# Patient Record
Sex: Male | Born: 1970 | Race: White | Hispanic: No | Marital: Married | Smoking: Former smoker
Health system: Southern US, Community
[De-identification: ages and names within clinical notes are randomized; demographics above are authoritative.]

## PROBLEM LIST (undated history)

## (undated) DIAGNOSIS — N289 Disorder of kidney and ureter, unspecified: Secondary | ICD-10-CM

---

## 2006-05-22 ENCOUNTER — Emergency Department: Payer: Self-pay | Admitting: Emergency Medicine

## 2008-07-24 ENCOUNTER — Emergency Department: Payer: Self-pay | Admitting: Emergency Medicine

## 2009-05-01 ENCOUNTER — Emergency Department: Payer: Self-pay | Admitting: Unknown Physician Specialty

## 2011-10-21 ENCOUNTER — Emergency Department: Payer: Self-pay | Admitting: Emergency Medicine

## 2014-02-24 ENCOUNTER — Emergency Department: Payer: Self-pay | Admitting: Emergency Medicine

## 2014-08-03 ENCOUNTER — Emergency Department: Payer: Self-pay | Admitting: Internal Medicine

## 2014-08-03 LAB — URINALYSIS, COMPLETE
BACTERIA: NONE SEEN
Bilirubin,UR: NEGATIVE
Blood: NEGATIVE
GLUCOSE, UR: NEGATIVE mg/dL (ref 0–75)
KETONE: NEGATIVE
Leukocyte Esterase: NEGATIVE
Nitrite: NEGATIVE
PH: 5 (ref 4.5–8.0)
PROTEIN: NEGATIVE
Specific Gravity: 1.034 (ref 1.003–1.030)
Squamous Epithelial: NONE SEEN
WBC UR: 1 /HPF (ref 0–5)

## 2016-10-02 ENCOUNTER — Encounter: Payer: Self-pay | Admitting: Emergency Medicine

## 2016-10-02 ENCOUNTER — Emergency Department: Payer: Self-pay

## 2016-10-02 ENCOUNTER — Emergency Department
Admission: EM | Admit: 2016-10-02 | Discharge: 2016-10-02 | Disposition: A | Discharge status: Home / Self Care | Payer: Self-pay | Attending: Emergency Medicine | Admitting: Emergency Medicine

## 2016-10-02 DIAGNOSIS — F17200 Nicotine dependence, unspecified, uncomplicated: Secondary | ICD-10-CM | POA: Insufficient documentation

## 2016-10-02 DIAGNOSIS — N200 Calculus of kidney: Secondary | ICD-10-CM

## 2016-10-02 HISTORY — DX: Disorder of kidney and ureter, unspecified: N28.9

## 2016-10-02 LAB — CBC
HEMATOCRIT: 44.2 % (ref 40.0–52.0)
HEMOGLOBIN: 15.2 g/dL (ref 13.0–18.0)
MCH: 31.4 pg (ref 26.0–34.0)
MCHC: 34.4 g/dL (ref 32.0–36.0)
MCV: 91.4 fL (ref 80.0–100.0)
Platelets: 245 10*3/uL (ref 150–440)
RBC: 4.83 MIL/uL (ref 4.40–5.90)
RDW: 13.7 % (ref 11.5–14.5)
WBC: 6.3 10*3/uL (ref 3.8–10.6)

## 2016-10-02 LAB — BASIC METABOLIC PANEL
ANION GAP: 8 (ref 5–15)
BUN: 22 mg/dL — ABNORMAL HIGH (ref 6–20)
CHLORIDE: 106 mmol/L (ref 101–111)
CO2: 24 mmol/L (ref 22–32)
Calcium: 9.4 mg/dL (ref 8.9–10.3)
Creatinine, Ser: 1.38 mg/dL — ABNORMAL HIGH (ref 0.61–1.24)
GFR calc non Af Amer: >60 mL/min (ref >60)
Glucose, Bld: 136 mg/dL — ABNORMAL HIGH (ref 65–99)
Potassium: 3.7 mmol/L (ref 3.5–5.1)
Sodium: 138 mmol/L (ref 135–145)

## 2016-10-02 LAB — URINALYSIS COMPLETE WITH MICROSCOPIC (ARMC ONLY)
Bilirubin Urine: NEGATIVE
Glucose, UA: NEGATIVE mg/dL
LEUKOCYTES UA: NEGATIVE
NITRITE: NEGATIVE
PROTEIN: 100 mg/dL — AB
SPECIFIC GRAVITY, URINE: 1.035 — AB (ref 1.005–1.030)
pH: 5 (ref 5.0–8.0)

## 2016-10-02 MED ORDER — ONDANSETRON HCL 4 MG/2ML IJ SOLN
4.0000 mg | Freq: Once | INTRAMUSCULAR | Status: AC
  Administered 2016-10-02: 4 mg via INTRAVENOUS
  Filled 2016-10-02: qty 2

## 2016-10-02 MED ORDER — TAMSULOSIN HCL 0.4 MG PO CAPS: 0.4000 mg | ORAL_CAPSULE | Freq: Every day | ORAL | 0 refills | Status: DC

## 2016-10-02 MED ORDER — MORPHINE SULFATE (PF) 4 MG/ML IV SOLN
4.0000 mg | Freq: Once | INTRAVENOUS | Status: AC
  Administered 2016-10-02: 4 mg via INTRAVENOUS
  Filled 2016-10-02: qty 1

## 2016-10-02 MED ORDER — OXYCODONE-ACETAMINOPHEN 5-325 MG PO TABS: 1.0000 | ORAL_TABLET | ORAL | 0 refills | Status: DC | PRN

## 2016-10-02 MED ORDER — ONDANSETRON HCL 4 MG PO TABS: 4.0000 mg | ORAL_TABLET | Freq: Three times a day (TID) | ORAL | 0 refills | Status: DC | PRN

## 2016-10-02 NOTE — Discharge Instructions (Signed)
Please seek medical attention for any high fevers, chest pain, shortness of breath, change in behavior, persistent vomiting, bloody stool or any other new or concerning symptoms.  

## 2016-10-02 NOTE — ED Provider Notes (Signed)
[facility] Emergency Department Provider Note    ____________________________________________   I have reviewed the triage vital signs and the nursing notes.   HISTORY  Chief Complaint Flank Pain   History limited by: Not Limited   HPI Scott PassyRobert C Morrison is a 45 y.o. male who presents to the emergency department today because of concerns for flank pain. It is located on the right side. It started today. It is severe. It is sharp. It radiates into his groin.It has been associated with nausea and vomiting. He states that he has had pain like this before when he is had a kidney stone. No fevers.    Past Medical History:  Diagnosis Date  . Renal disorder     There are no active problems to display for this patient.   History reviewed. No pertinent surgical history.  Prior to Admission medications   Medication Sig Start Date End Date Taking? Authorizing Provider  ondansetron (ZOFRAN) 4 MG tablet Take 1 tablet (4 mg total) by mouth every 8 (eight) hours as needed for nausea or vomiting. 10/02/16   [name] Yareliz Thorstenson, MD  oxyCODONE-acetaminophen (ROXICET) 5-325 MG tablet Take 1 tablet by mouth every 4 (four) hours as needed for severe pain. 10/02/16   [name] [name], MD  tamsulosin (FLOMAX) 0.4 MG CAPS capsule Take 1 capsule (0.4 mg total) by mouth daily. 10/02/16   [name] [name], MD    Allergies Patient has no known allergies.  History reviewed. No pertinent family history.  Social History Social History  Substance Use Topics  . Smoking status: Current Every Day Smoker  . Smokeless tobacco: Never Used  . Alcohol use Yes    Review of Systems  Constitutional: Negative for fever. Cardiovascular: Negative for chest pain. Respiratory: Negative for shortness of breath. Gastrointestinal: Positive for right flank pain Genitourinary: Negative for dysuria. Neurological: Negative for headaches, focal weakness or numbness.  10-point ROS otherwise  negative.  ____________________________________________   PHYSICAL EXAM:  VITAL SIGNS: ED Triage Vitals  Enc Vitals Group     BP 10/02/16 1357 (!) 142/91     Pulse Rate 10/02/16 1357 71     Resp 10/02/16 1357 16     Temp 10/02/16 1357 97.3 F (36.3 C)     Temp Source 10/02/16 1357 Oral     SpO2 10/02/16 1357 96 %     Weight 10/02/16 1348 260 lb (117.9 kg)     Height 10/02/16 1348 5\' 11"  (1.803 m)     Head Circumference --      Peak Flow --      Pain Score 10/02/16 1348 10   Constitutional: Alert and oriented. Appears uncomfortable Eyes: Conjunctivae are normal. Normal extraocular movements. ENT   Head: Normocephalic and atraumatic.   Nose: No congestion/rhinnorhea.   Mouth/Throat: Mucous membranes are moist.   Neck: No stridor. Hematological/Lymphatic/Immunilogical: No cervical lymphadenopathy. Cardiovascular: Normal rate, regular rhythm.  No murmurs, rubs, or gallops. Respiratory: Normal respiratory effort without tachypnea nor retractions. Breath sounds are clear and equal bilaterally. No wheezes/rales/rhonchi. Gastrointestinal: Soft and nontender. No distention.  Genitourinary: Deferred Musculoskeletal: Normal range of motion in all extremities. No lower extremity edema. Neurologic:  Normal speech and language. No gross focal neurologic deficits are appreciated.  Skin:  Skin is warm, dry and intact. No rash noted. Psychiatric: Mood and affect are normal. Speech and behavior are normal. Patient exhibits appropriate insight and judgment.  ____________________________________________    LABS (pertinent positives/negatives)  Labs Reviewed  URINALYSIS COMPLETEWITH MICROSCOPIC ([facility] ONLY) -  Abnormal; Notable for the following:       Result Value   Color, Urine AMBER (*)    APPearance HAZY (*)    Ketones, ur TRACE (*)    Specific Gravity, Urine 1.035 (*)    Hgb urine dipstick 3+ (*)    Protein, ur 100 (*)    Bacteria, UA RARE (*)    Squamous Epithelial  / LPF 0-5 (*)    All other components within normal limits  BASIC METABOLIC PANEL - Abnormal; Notable for the following:    Glucose, Bld 136 (*)    BUN 22 (*)    Creatinine, Ser 1.38 (*)    All other components within normal limits  CBC     ____________________________________________   EKG  None  ____________________________________________    RADIOLOGY  US renal IMPRESSION: Mild fullness of the right pelvis compared to the left, with no other comparison. This may reflect extrarenal pelvis, however, if there is concern for a distal ureteral obstruction, noncontrast CT should be considered.  ____________________________________________   PROCEDURES  Procedures  ____________________________________________   INITIAL IMPRESSION / ASSESSMENT AND PLAN / ED COURSE  Pertinent labs & imaging results that were available during my care of the patient were reviewed by me and considered in my medical decision making (see chart for details).  Patient with right-sided flank pain. History of kidney stones. Urine does have red blood cells without any white blood cells. No leukocytosis and serum. At this point I think complicated kidney stone. Ultrasound did not show any significant hydronephrosis. Will give patient pain medication, nausea medication and Flomax. Will give patient urology follow-up information.  ____________________________________________   FINAL CLINICAL IMPRESSION(S) / ED DIAGNOSES  Final diagnoses:  Kidney stone     Note: This dictation was prepared with Dragon dictation. Any transcriptional errors that result from this process are unintentional    [name] [name], MD 10/02/16 1614

## 2016-10-02 NOTE — ED Triage Notes (Signed)
Pt to ed with c/o right flank and right groin pain acute onset today at 1200.  Pt states hx of kidney stones.

## 2016-10-07 ENCOUNTER — Encounter: Payer: Self-pay | Admitting: Emergency Medicine

## 2016-10-07 ENCOUNTER — Emergency Department
Admission: EM | Admit: 2016-10-07 | Discharge: 2016-10-07 | Disposition: A | Discharge status: Home / Self Care | Payer: Self-pay | Attending: Emergency Medicine | Admitting: Emergency Medicine

## 2016-10-07 ENCOUNTER — Emergency Department: Payer: Self-pay

## 2016-10-07 DIAGNOSIS — N23 Unspecified renal colic: Secondary | ICD-10-CM | POA: Insufficient documentation

## 2016-10-07 DIAGNOSIS — F17200 Nicotine dependence, unspecified, uncomplicated: Secondary | ICD-10-CM | POA: Insufficient documentation

## 2016-10-07 DIAGNOSIS — R109 Unspecified abdominal pain: Secondary | ICD-10-CM

## 2016-10-07 LAB — BASIC METABOLIC PANEL
ANION GAP: 6 (ref 5–15)
BUN: 14 mg/dL (ref 6–20)
CHLORIDE: 104 mmol/L (ref 101–111)
CO2: 29 mmol/L (ref 22–32)
Calcium: 9.2 mg/dL (ref 8.9–10.3)
Creatinine, Ser: 0.98 mg/dL (ref 0.61–1.24)
GFR calc Af Amer: >60 mL/min (ref >60)
GLUCOSE: 87 mg/dL (ref 65–99)
POTASSIUM: 3.8 mmol/L (ref 3.5–5.1)
Sodium: 139 mmol/L (ref 135–145)

## 2016-10-07 LAB — URINALYSIS COMPLETE WITH MICROSCOPIC (ARMC ONLY)
BILIRUBIN URINE: NEGATIVE
Bacteria, UA: NONE SEEN
Glucose, UA: NEGATIVE mg/dL
KETONES UR: NEGATIVE mg/dL
LEUKOCYTES UA: NEGATIVE
Nitrite: NEGATIVE
PH: 5 (ref 5.0–8.0)
PROTEIN: 30 mg/dL — AB
SQUAMOUS EPITHELIAL / LPF: NONE SEEN
Specific Gravity, Urine: 1.024 (ref 1.005–1.030)

## 2016-10-07 LAB — CBC
HEMATOCRIT: 44.4 % (ref 40.0–52.0)
HEMOGLOBIN: 15 g/dL (ref 13.0–18.0)
MCH: 31.1 pg (ref 26.0–34.0)
MCHC: 33.9 g/dL (ref 32.0–36.0)
MCV: 91.9 fL (ref 80.0–100.0)
Platelets: 240 10*3/uL (ref 150–440)
RBC: 4.83 MIL/uL (ref 4.40–5.90)
RDW: 13.6 % (ref 11.5–14.5)
WBC: 7.5 10*3/uL (ref 3.8–10.6)

## 2016-10-07 MED ORDER — OXYCODONE-ACETAMINOPHEN 5-325 MG PO TABS: 1.0000 | ORAL_TABLET | Freq: Four times a day (QID) | ORAL | 0 refills | Status: DC | PRN

## 2016-10-07 MED ORDER — KETOROLAC TROMETHAMINE 30 MG/ML IJ SOLN
30.0000 mg | Freq: Once | INTRAMUSCULAR | Status: AC
  Administered 2016-10-07: 30 mg via INTRAVENOUS
  Filled 2016-10-07: qty 1

## 2016-10-07 MED ORDER — KETOROLAC TROMETHAMINE 10 MG PO TABS: 10.0000 mg | ORAL_TABLET | Freq: Three times a day (TID) | ORAL | 0 refills | Status: DC | PRN

## 2016-10-07 MED ORDER — SODIUM CHLORIDE 0.9 % IV BOLUS (SEPSIS)
1000.0000 mL | Freq: Once | INTRAVENOUS | Status: AC
  Administered 2016-10-07: 1000 mL via INTRAVENOUS

## 2016-10-07 NOTE — ED Triage Notes (Signed)
Pt presents with recurrent right side flank pain. Was seen Friday for kidney stone, discharged and expected to pass stone but has not. Pt with pain 10/10 sharp

## 2016-10-07 NOTE — Discharge Instructions (Signed)
Please call to make an appointment with Dr. Apolinar JunesBrandon for further evaluation of your kidney stone.  Return to the emergency department for severe pain, inability to keep down fluids, fever, or any other symptoms concerning to you.

## 2016-10-07 NOTE — ED Provider Notes (Signed)
[facility] Emergency Department Provider Note  ____________________________________________  Time seen: Approximately 1:53 PM  I have reviewed the triage vital signs and the nursing notes.   HISTORY  Chief Complaint Flank Pain    HPI Scott Morrison is a 45 y.o. male with a history of renal colic, presenting with ongoing right flank pain radiating to the right lower quadrant with urinary symptoms. The patient was seen here 6 days ago, with the same symptoms plus vomiting, which has now resolved. He had an ultrasound which showed rightfullness in the pelvis concerning for stone. He had a mildly increased creatinine, but no significant blood in the urine, and his symptoms were well controlled. Since then, he continues to have intermittent sharp pains and occasionally is not symptomatic. He has not had fever, chills, or recent nausea or vomiting. No diarrhea. He occasionally has changes in the stream of his urine or painful urination.   Past Medical History:  Diagnosis Date  . Renal disorder     There are no active problems to display for this patient.   History reviewed. No pertinent surgical history.  Current Outpatient Rx  . Order #: [id number] Class: Print  . Order #: [id number] Class: Print  . Order #: [id number] Class: Print    Allergies Patient has no known allergies.  History reviewed. No pertinent family history.  Social History Social History  Substance Use Topics  . Smoking status: Current Every Day Smoker  . Smokeless tobacco: Never Used  . Alcohol use Yes    Review of Systems Constitutional: No fever/chills. Eyes: No visual changes. ENT: No sore throat. No congestion or rhinorrhea. Cardiovascular: Denies chest pain. Denies palpitations. Respiratory: Denies shortness of breath.  No cough. Gastrointestinal: Positive right flank pain radiating to the right lower quadrant.  No nausea, no vomiting.  No diarrhea.  No  constipation. Genitourinary: Positive for dysuria. No hematuria. Positive change in flow. Musculoskeletal: Negative for back pain except for right flank pain. Skin: Negative for rash. Neurological: Negative for headaches. No focal numbness, tingling or weakness.   10-point ROS otherwise negative.  ____________________________________________   PHYSICAL EXAM:  VITAL SIGNS: ED Triage Vitals  Enc Vitals Group     BP 10/07/16 1329 136/81     Pulse Rate 10/07/16 1329 74     Resp 10/07/16 1329 18     Temp --      Temp src --      SpO2 10/07/16 1329 98 %     Weight 10/07/16 1331 260 lb (117.9 kg)     Height 10/07/16 1331 5\' 11"  (1.803 m)     Head Circumference --      Peak Flow --      Pain Score 10/07/16 1331 10     Pain Loc --      Pain Edu? --      Excl. in GC? --     Constitutional: Alert and oriented. Well appearing and in no acute distress. Answers questions appropriately. Eyes: Conjunctivae are normal.  EOMI. No scleral icterus. Head: Atraumatic. Nose: No congestion/rhinnorhea. Mouth/Throat: Mucous membranes are moist.  Neck: No stridor.  Supple.   Cardiovascular: Normal rate, regular rhythm. No murmurs, rubs or gallops.  Respiratory: Normal respiratory effort.  No accessory muscle use or retractions. Lungs CTAB.  No wheezes, rales or ronchi. Gastrointestinal: Obese. Minimal tenderness to palpation in the right lower quadrant. Soft and nondistended.  No guarding or rebound.  No peritoneal signs. Musculoskeletal: No LE edema. Neurologic:  A&Ox3.  Speech  is clear.  Face and smile are symmetric.  EOMI.  Moves all extremities well. Skin:  Skin is warm, dry and intact. No rash noted. Psychiatric: Mood and affect are normal. Speech and behavior are normal.  Normal judgement.  ____________________________________________   LABS (all labs ordered are listed, but only abnormal results are displayed)  Labs Reviewed  CBC  BASIC METABOLIC PANEL  URINALYSIS COMPLETEWITH  MICROSCOPIC ([facility] ONLY)   ____________________________________________  EKG  Not indicated ____________________________________________  RADIOLOGY  No results found.  ____________________________________________   PROCEDURES  Procedure(s) performed: None  Procedures  Critical Care performed: No ____________________________________________   INITIAL IMPRESSION / ASSESSMENT AND PLAN / ED COURSE  Pertinent labs & imaging results that were available during my care of the patient were reviewed by me and considered in my medical decision making (see chart for details).  45 y.o. male with a history of renal colic presenting with 6 days of right flank and right lower quadrant pain with urinary symptoms and imaging several days ago suggestive of renal stone. Will initiate symptomatic treatment for the patient, get a CT scan to evaluate the position of the stone, and make sure that the patient does not have a bump in his creatinine, a significantly elevated white blood cell count, or urinary tract infection. If the patient's workup in the emergency department is reassuring, we will plan to discharge the patient home with urology follow-up. He states he was given the phone number at his previous visit, but did not make an appointment yet.  ----------------------------------------- 3:17 PM on 10/07/2016 -----------------------------------------  At this time, the patient's pain is completely resolved. He does have a 4 mm stone at the right UVJ, but his creatinine is normal, his white blood cell count is 7.5, he does not have evidence of infection on his urinalysis. I will plan to discharge the patient with a strainer, several more tablets of Percocet as he only has 5 left, Toradol, and the phone number for Dr. Morrie SheldonAshley Brandon's office to make a close follow-up appointment. Return precautions as well as follow-up instructions were discussed with the pt and his  wife.  ____________________________________________  FINAL CLINICAL IMPRESSION(S) / ED DIAGNOSES  Final diagnoses:  Right flank pain    Clinical Course as of Oct 07 1516  Wed Oct 07, 2016  1423 The patient does not have any evidence of infection in his urine, his white blood cell count is normal, and his creatinine is also normal. I'm awaiting the results of the CT scan, but anticipate discharge home with close urology follow-up.  [AN]    Clinical Course User Index [AN] Rockne MenghiniAnne-Caroline Dereon Corkery, MD      NEW MEDICATIONS STARTED DURING THIS VISIT:  New Prescriptions   No medications on file      Rockne MenghiniAnne-Caroline Domenico Achord, MD 10/07/16 1520

## 2016-11-10 ENCOUNTER — Emergency Department
Admission: EM | Admit: 2016-11-10 | Discharge: 2016-11-10 | Disposition: A | Discharge status: Home / Self Care | Payer: Self-pay | Attending: Emergency Medicine | Admitting: Emergency Medicine

## 2016-11-10 ENCOUNTER — Encounter: Payer: Self-pay | Admitting: Emergency Medicine

## 2016-11-10 DIAGNOSIS — F17200 Nicotine dependence, unspecified, uncomplicated: Secondary | ICD-10-CM | POA: Insufficient documentation

## 2016-11-10 DIAGNOSIS — B9789 Other viral agents as the cause of diseases classified elsewhere: Secondary | ICD-10-CM

## 2016-11-10 DIAGNOSIS — J069 Acute upper respiratory infection, unspecified: Secondary | ICD-10-CM | POA: Insufficient documentation

## 2016-11-10 MED ORDER — FLUTICASONE PROPIONATE 50 MCG/ACT NA SUSP: 2.0000 | Freq: Every day | NASAL | 0 refills | Status: DC

## 2016-11-10 MED ORDER — OXYCODONE-ACETAMINOPHEN 5-325 MG PO TABS
ORAL_TABLET | ORAL | Status: AC
  Filled 2016-11-10: qty 1

## 2016-11-10 MED ORDER — PROMETHAZINE-DM 6.25-15 MG/5ML PO SYRP: 5.0000 mL | ORAL_SOLUTION | Freq: Four times a day (QID) | ORAL | 0 refills | Status: DC | PRN

## 2016-11-10 NOTE — ED Notes (Signed)
See triage note .states  He developed a cough with some congestion several days ago  Possible fever at home   Afebrile on arrival

## 2016-11-10 NOTE — ED Triage Notes (Signed)
C/o cough and congestion and fever.  NAD

## 2016-11-10 NOTE — ED Provider Notes (Signed)
[facility] Emergency Department Provider Note  ____________________________________________  Time seen: Approximately 9:52 AM  I have reviewed the triage vital signs and the nursing notes.   HISTORY  Chief Complaint Cough    HPI Scott Morrison is a 45 y.o. male , NAD, presents to the emergency department with 1 week history of cough and chest congestion. States his significant other had onset of similar symptoms approximately 10 days ago. Was seen by her primary care provider diagnosed with viral syndrome. States he began with the same symptoms a few days after her period had low-grade temperatures of 99-100F over the first 3-4 days with body aches but those symptoms have resolved. States she was sent home from work today due to looking fatigued and coughing. Denies any chest pain, shortness breath or wheezing. Has had no abdominal pain, nausea or vomiting nor any diarrhea. Has been taking over-the-counter DayQuil and NyQuil with symptom improvement but no resolution of symptoms. Denies any sinus pressure or headache, ear pain, sore throat. Has had continued nasal congestion and runny nose.   Past Medical History:  Diagnosis Date  . Renal disorder     There are no active problems to display for this patient.   History reviewed. No pertinent surgical history.  Prior to Admission medications   Medication Sig Start Date End Date Taking? Authorizing Provider  fluticasone (FLONASE) 50 MCG/ACT nasal spray Place 2 sprays into both nostrils daily. 11/10/16   Mart Colpitts L Aldena Worm, PA-C  promethazine-dextromethorphan (PROMETHAZINE-DM) 6.25-15 MG/5ML syrup Take 5 mLs by mouth 4 (four) times daily as needed for cough. 11/10/16   Melba Araki L Vicky Schleich, PA-C    Allergies Patient has no known allergies.  No family history on file.  Social History Social History  Substance Use Topics  . Smoking status: Current Every Day Smoker  . Smokeless tobacco: Never Used  . Alcohol use  Yes     Review of Systems Constitutional: No fever/chills, Fatigue Eyes: No visual changes. No discharge ENT: Positive runny nose and nasal congestion. No sore throat, sinus pressure, ear pain, ear drainage. Cardiovascular: No chest pain. Respiratory: Positive cough, chest congestion No shortness of breath. No wheezing.  Gastrointestinal: No abdominal pain.  No nausea, vomiting.  No diarrhea.   Musculoskeletal: Negative for General myalgias.  Skin: Negative for rash. Neurological: Negative for headaches. 10-point ROS otherwise negative.  ____________________________________________   PHYSICAL EXAM:  VITAL SIGNS: ED Triage Vitals  Enc Vitals Group     BP 11/10/16 0914 (!) 131/101     Pulse Rate 11/10/16 0914 79     Resp 11/10/16 0914 20     Temp 11/10/16 0914 97.5 F (36.4 C)     Temp Source 11/10/16 0914 Oral     SpO2 11/10/16 0914 97 %     Weight 11/10/16 0914 260 lb (117.9 kg)     Height 11/10/16 0914 5\' 10"  (1.778 m)     Head Circumference --      Peak Flow --      Pain Score 11/10/16 0915 0     Pain Loc --      Pain Edu? --      Excl. in GC? --      Constitutional: Alert and oriented. Well appearing and in no acute distress. Eyes: Conjunctivae are normal Without icterus, injection, discharge Head: Atraumatic. ENT:      Ears: Bilateral TMs visualized without erythema, effusion, bulging or perforation.      Nose: Mild congestion with clear rhinorrhea.  Mouth/Throat: Mucous membranes are moist. Pharynx without erythema, swelling, exudate. Uvula midline. Airway patent. Clear postnasal drip. Neck: Supple with full range of motion Hematological/Lymphatic/Immunilogical: No cervical lymphadenopathy. Cardiovascular: Normal rate, regular rhythm. Normal S1 and S2.  Good peripheral circulation. Respiratory: Normal respiratory effort without tachypnea or retractions. Lungs CTAB with breath sounds noted in all lung fields. No wheeze, rhonchi, rales Neurologic:  Normal  speech and language. No gross focal neurologic deficits are appreciated.  Skin:  Skin is warm, dry and intact. No rash noted. Psychiatric: Mood and affect are normal. Speech and behavior are normal. Patient exhibits appropriate insight and judgement.   ____________________________________________   LABS  None ____________________________________________  EKG  None ____________________________________________  RADIOLOGY  None ____________________________________________    PROCEDURES  Procedure(s) performed: None   Procedures   Medications - No data to display   ____________________________________________   INITIAL IMPRESSION / ASSESSMENT AND PLAN / ED COURSE  Pertinent labs & imaging results that were available during my care of the patient were reviewed by me and considered in my medical decision making (see chart for details).  Clinical Course     Patient's diagnosis is consistent with Viral URI with cough. Patient will be discharged home with prescriptions for Flonase and promethazine DM to take his rectum. May continue over-the-counter Tylenol or ibuprofen as needed. [facility] if symptoms persist past this treatment course. Patient is given ED precautions to return to the ED for any worsening or new symptoms.    ____________________________________________  FINAL CLINICAL IMPRESSION(S) / ED DIAGNOSES  Final diagnoses:  Viral URI with cough      NEW MEDICATIONS STARTED DURING THIS VISIT:  Discharge Medication List as of 11/10/2016 10:00 AM    START taking these medications   Details  fluticasone (FLONASE) 50 MCG/ACT nasal spray Place 2 sprays into both nostrils daily., Starting Tue 11/10/2016, Print    promethazine-dextromethorphan (PROMETHAZINE-DM) 6.25-15 MG/5ML syrup Take 5 mLs by mouth 4 (four) times daily as needed for cough., Starting Tue 11/10/2016, Print             Hope PigeonJami L Xzavion Doswell,  PA-C 11/10/16 1044    Arnaldo NatalPaul F Malinda, MD 11/10/16 [phone]

## 2018-04-06 IMAGING — US US RENAL
1 series · 14 of 25 positions shown · non-contrast
Comparison: With right flank pain

CLINICAL DATA: 45-year-old male

EXAM:
RENAL / URINARY TRACT ULTRASOUND COMPLETE

[Series 1: us renal · 0.28mm/px · 14 of 44 slices shown]
[im 1/44]
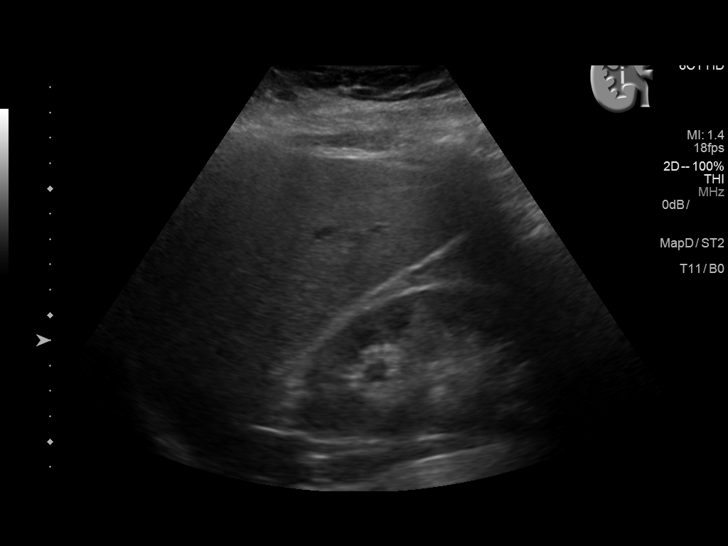
[im 4/44]
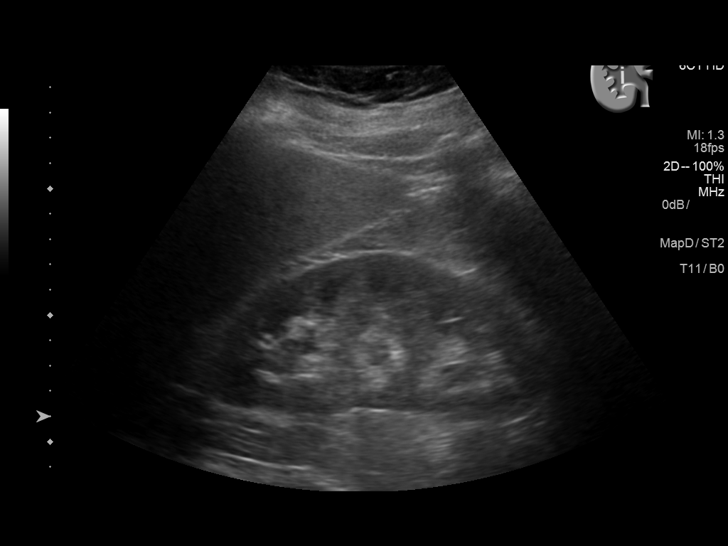
[im 8/44]
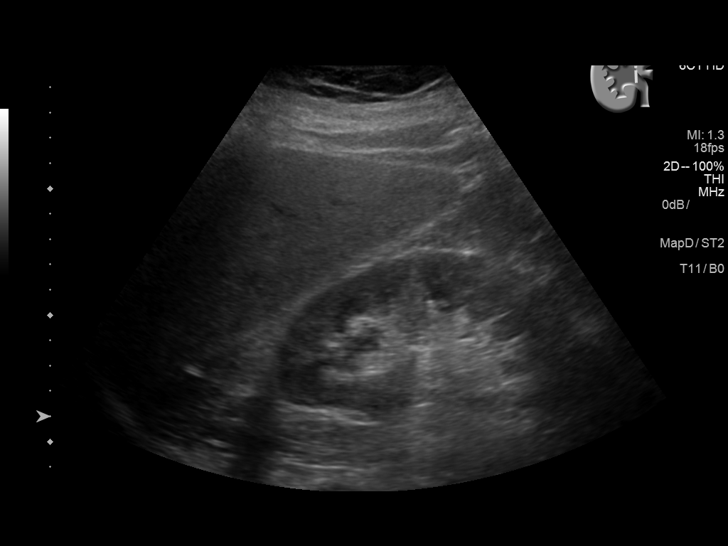
[im 11/44]
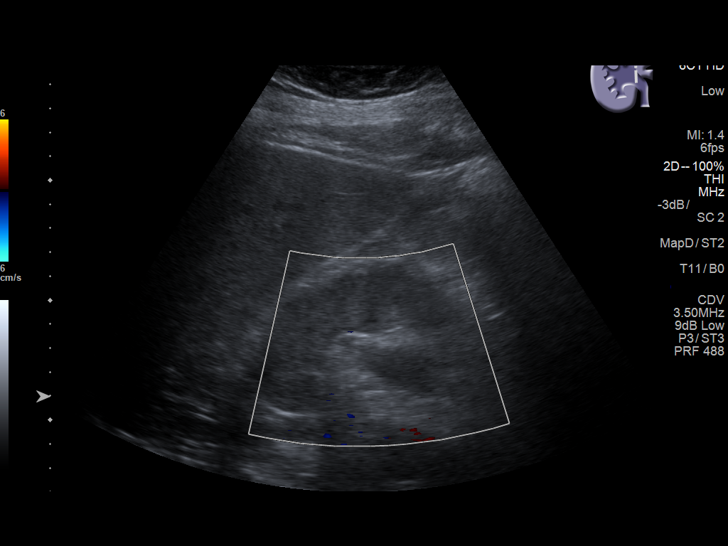
[im 15/44]
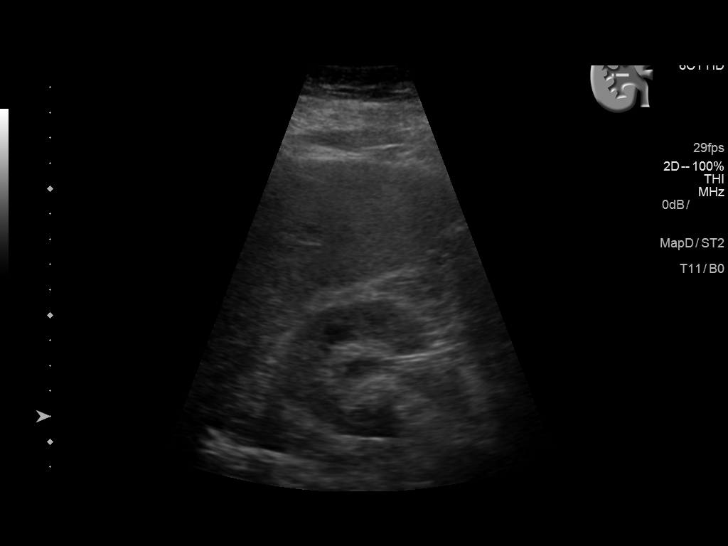
[im 17/44]
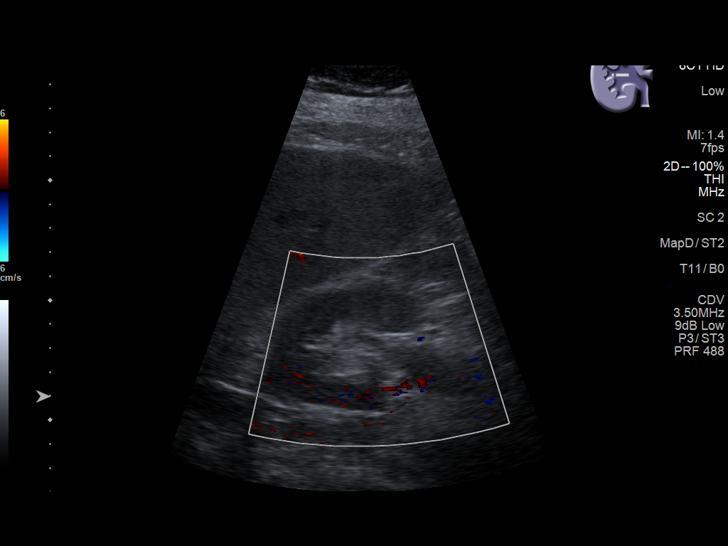
[im 20/44]
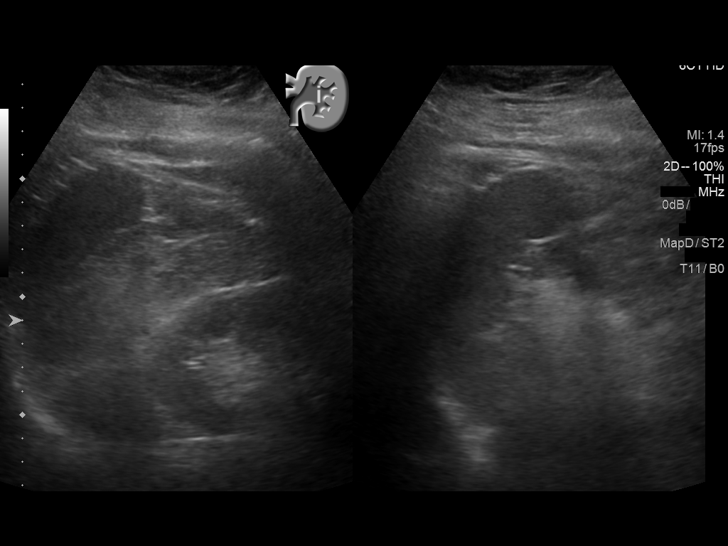
[im 24/44]
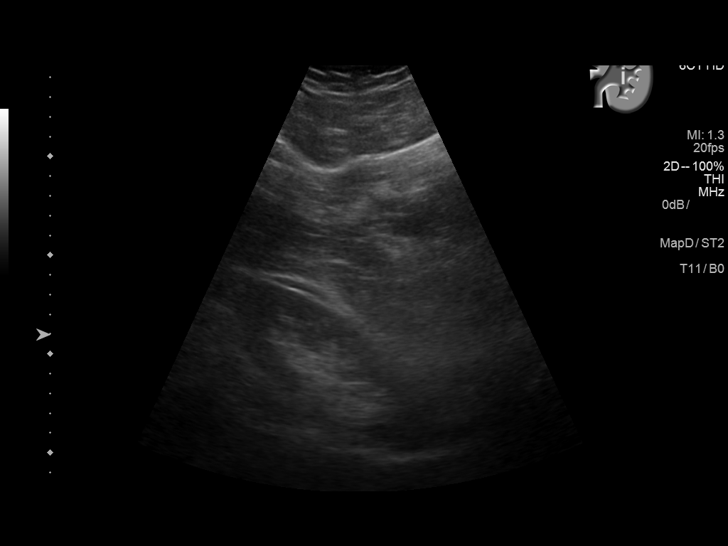
[im 27/44]
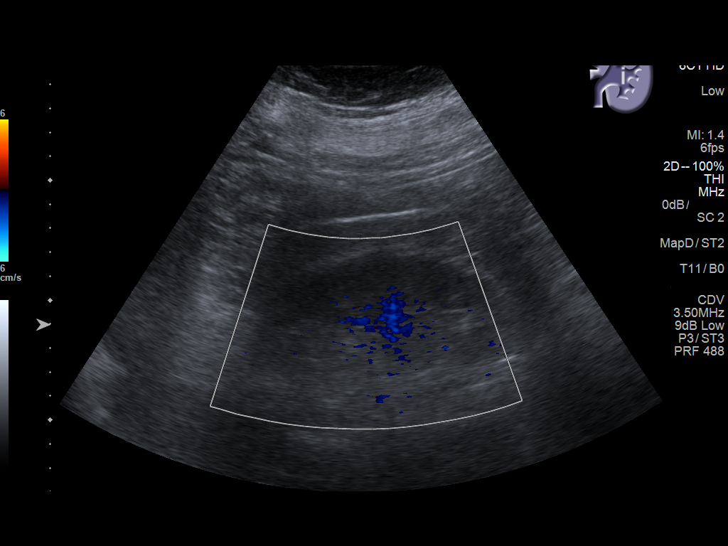
[im 29/44]
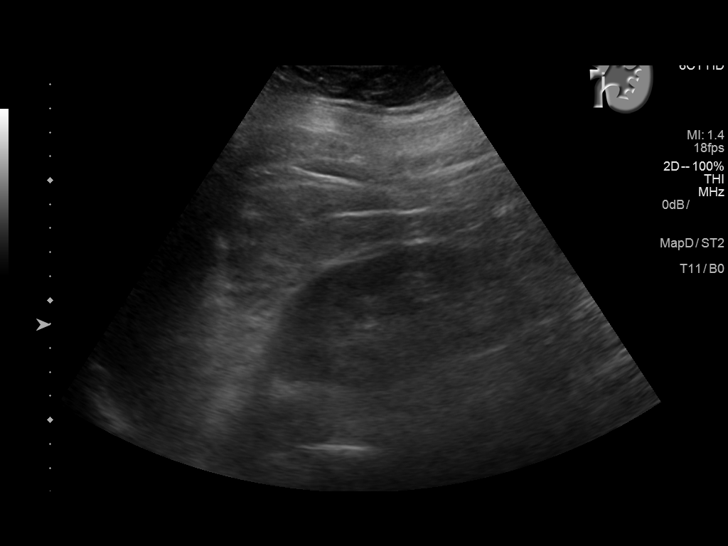
[im 33/44]
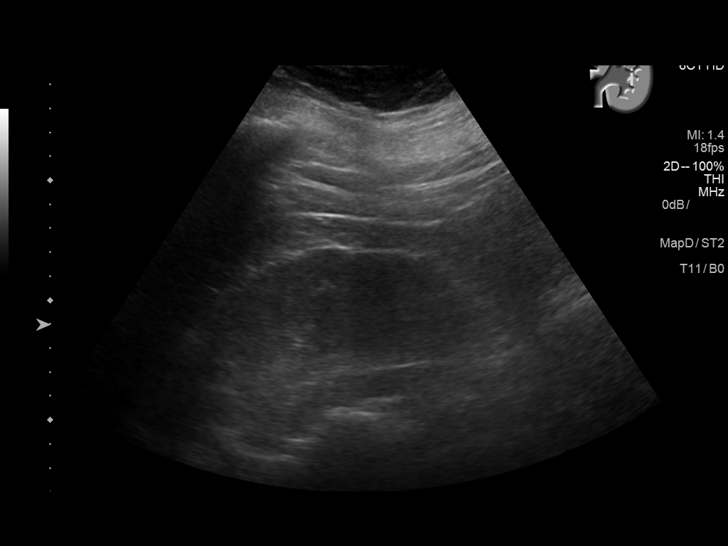
[im 36/44]
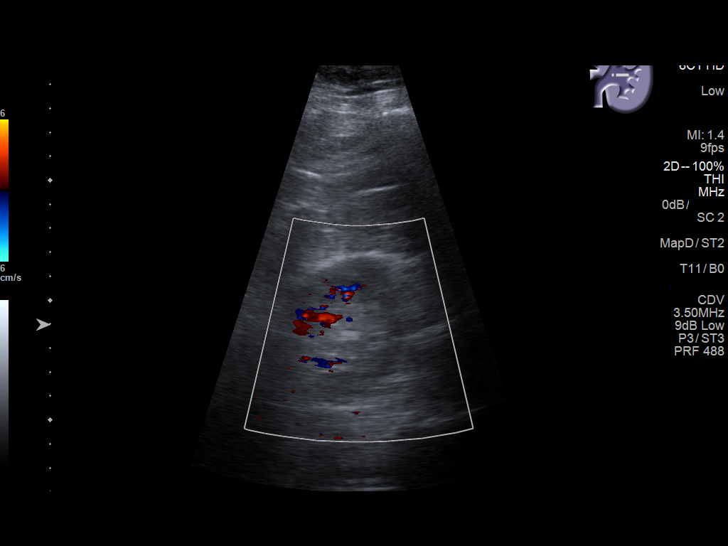
[im 40/44]
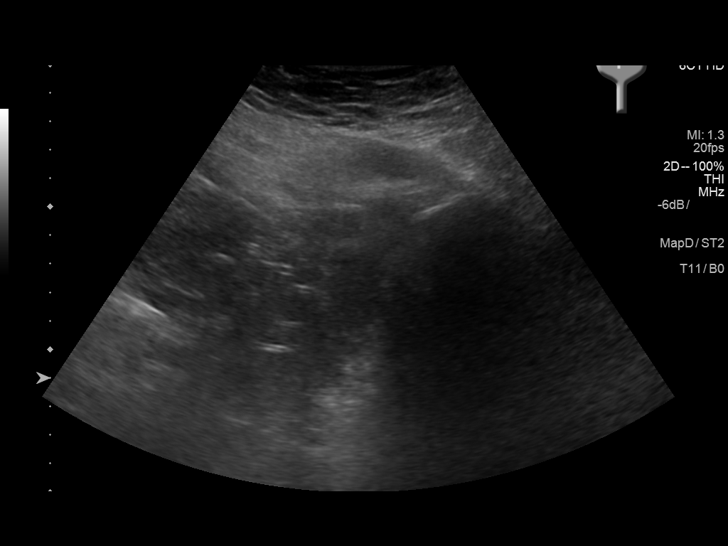
[im 44/44]
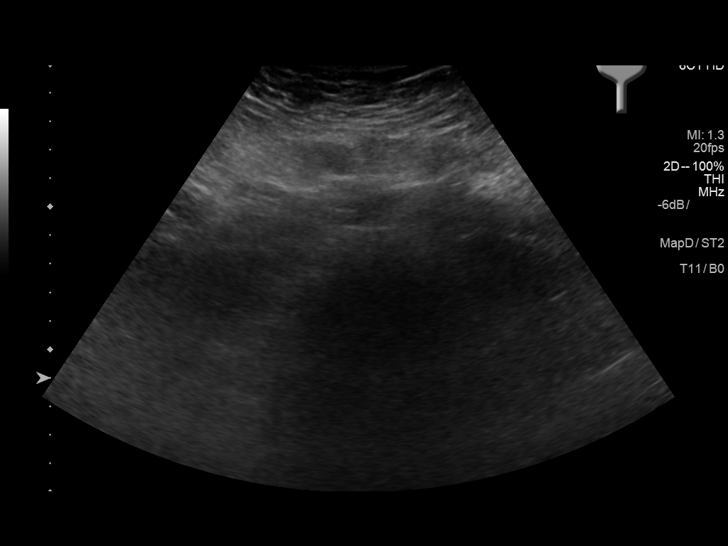

[14 of 25 positions shown; findings below may reference images not displayed]

FINDINGS: Right Kidney:

Length: 13.3 cm. Parenchymal echogenicity within normal limits. No
cortical thinning. Mild fullness of the right pelvis compared to the
left. No echogenic foci. Flow confirmed within the hilum of the left
kidney.

Left Kidney:

Length: 12.0 cm. Parenchymal echogenicity unremarkable. No cortical
thinning. No evidence of left-sided hydronephrosis. No echogenic
foci.

Bladder:

Appears normal for degree of bladder distention.
IMPRESSION: Mild fullness of the right pelvis compared to the left, with no
other comparison. This may reflect extrarenal pelvis, however, if
there is concern for a distal ureteral obstruction, noncontrast CT
should be considered.

## 2018-05-15 ENCOUNTER — Encounter: Payer: Self-pay | Admitting: Emergency Medicine

## 2018-05-15 ENCOUNTER — Emergency Department
Admission: EM | Admit: 2018-05-15 | Discharge: 2018-05-15 | Disposition: A | Discharge status: Home / Self Care | Payer: Self-pay | Attending: Emergency Medicine | Admitting: Emergency Medicine

## 2018-05-15 DIAGNOSIS — F17210 Nicotine dependence, cigarettes, uncomplicated: Secondary | ICD-10-CM | POA: Insufficient documentation

## 2018-05-15 DIAGNOSIS — R42 Dizziness and giddiness: Secondary | ICD-10-CM | POA: Insufficient documentation

## 2018-05-15 LAB — CBC
HCT: 43.4 % (ref 40.0–52.0)
Hemoglobin: 15 g/dL (ref 13.0–18.0)
MCH: 30.9 pg (ref 26.0–34.0)
MCHC: 34.6 g/dL (ref 32.0–36.0)
MCV: 89.2 fL (ref 80.0–100.0)
PLATELETS: 266 10*3/uL (ref 150–440)
RBC: 4.87 MIL/uL (ref 4.40–5.90)
RDW: 14 % (ref 11.5–14.5)
WBC: 5.7 10*3/uL (ref 3.8–10.6)

## 2018-05-15 LAB — URINALYSIS, COMPLETE (UACMP) WITH MICROSCOPIC
Bacteria, UA: NONE SEEN
Bilirubin Urine: NEGATIVE
Glucose, UA: NEGATIVE mg/dL
HGB URINE DIPSTICK: NEGATIVE
Ketones, ur: NEGATIVE mg/dL
Leukocytes, UA: NEGATIVE
Nitrite: NEGATIVE
Protein, ur: 30 mg/dL — AB
SPECIFIC GRAVITY, URINE: 1.028 (ref 1.005–1.030)
pH: 5 (ref 5.0–8.0)

## 2018-05-15 LAB — BASIC METABOLIC PANEL
ANION GAP: 8 (ref 5–15)
BUN: 17 mg/dL (ref 6–20)
CALCIUM: 9 mg/dL (ref 8.9–10.3)
CHLORIDE: 107 mmol/L (ref 101–111)
CO2: 23 mmol/L (ref 22–32)
Creatinine, Ser: 0.77 mg/dL (ref 0.61–1.24)
GFR calc non Af Amer: >60 mL/min (ref >60)
Glucose, Bld: 125 mg/dL — ABNORMAL HIGH (ref 65–99)
POTASSIUM: 4.2 mmol/L (ref 3.5–5.1)
SODIUM: 138 mmol/L (ref 135–145)

## 2018-05-15 MED ORDER — MECLIZINE HCL 25 MG PO TABS: 25.0000 mg | ORAL_TABLET | Freq: Three times a day (TID) | ORAL | 0 refills | Status: DC | PRN

## 2018-05-15 MED ORDER — MECLIZINE HCL 25 MG PO TABS
25.0000 mg | ORAL_TABLET | Freq: Once | ORAL | Status: AC
  Administered 2018-05-15: 25 mg via ORAL
  Filled 2018-05-15: qty 1

## 2018-05-15 MED ORDER — SODIUM CHLORIDE 0.9 % IV SOLN
1000.0000 mL | Freq: Once | INTRAVENOUS | Status: AC
  Administered 2018-05-15: 1000 mL via INTRAVENOUS

## 2018-05-15 MED ORDER — LORAZEPAM 2 MG/ML IJ SOLN
0.5000 mg | Freq: Once | INTRAMUSCULAR | Status: AC
  Administered 2018-05-15: 0.5 mg via INTRAVENOUS
  Filled 2018-05-15: qty 1

## 2018-05-15 NOTE — ED Triage Notes (Signed)
Patient presents to the ED with dizziness x 3-4 days.  Patient reports nausea, denies vomiting.  Patient states dizziness is worse when he moves, particularly when he moves his head.

## 2018-05-15 NOTE — ED Notes (Signed)
First Nurse Note: Pt to ED via POV c/o dizziness for a few days, worse today. Pt states that it feels like the room is spinning and he is drunk. Pt states that he has nausea but no vomiting.

## 2018-05-15 NOTE — ED Provider Notes (Signed)
[facility] Emergency Department Provider Note   ____________________________________________    I have reviewed the triage vital signs and the nursing notes.   HISTORY  Chief Complaint Dizziness   HPI Scott Morrison is a 47 y.o. male who presents with complaints of dizziness.  Patient reports when he rolled over this morning he felt a spinning sensation.  He reports he has had this several times in the past but never this severe.  Denies headache or neuro deficits.  No ear pain, loss of hearing or tinnitus.  No fevers or chills.  No recent viral illnesses.  Has not taken anything for this.   Past Medical History:  Diagnosis Date  . Renal disorder     There are no active problems to display for this patient.   History reviewed. No pertinent surgical history.  Prior to Admission medications   Medication Sig Start Date End Date Taking? Authorizing Provider  fluticasone (FLONASE) 50 MCG/ACT nasal spray Place 2 sprays into both nostrils daily. Patient not taking: Reported on 05/15/2018 11/10/16   Hagler, Jami L, PA-C  meclizine (ANTIVERT) 25 MG tablet Take 1 tablet (25 mg total) by mouth 3 (three) times daily as needed for dizziness. 05/15/18   Jene Every, MD  promethazine-dextromethorphan (PROMETHAZINE-DM) 6.25-15 MG/5ML syrup Take 5 mLs by mouth 4 (four) times daily as needed for cough. Patient not taking: Reported on 05/15/2018 11/10/16   Hagler, Clearnce Hasten L, PA-C     Allergies Patient has no known allergies.  No family history on file.  Social History Social History   Tobacco Use  . Smoking status: Current Every Day Smoker    Packs/day: 1.00    Types: Cigarettes  . Smokeless tobacco: Never Used  Substance Use Topics  . Alcohol use: Yes    Comment: occasionally  . Drug use: No    Review of Systems  Constitutional: No fever/chills Eyes: No visual changes.  ENT: No sore throat. Cardiovascular: Denies palpitations Respiratory:  Denies cough Gastrointestinal: Mild nausea.   Genitourinary: Negative for dysuria. Musculoskeletal: Negative for back pain. Skin: Negative for rash. Neurological: Negative for headaches or weakness   ____________________________________________   PHYSICAL EXAM:  VITAL SIGNS: ED Triage Vitals  Enc Vitals Group     BP 05/15/18 1011 118/81     Pulse Rate 05/15/18 1011 63     Resp 05/15/18 1011 18     Temp 05/15/18 1011 97.6 F (36.4 C)     Temp Source 05/15/18 1011 Oral     SpO2 05/15/18 1011 97 %     Weight 05/15/18 1013 113.4 kg (250 lb)     Height 05/15/18 1013 1.803 m (5\' 11" )     Head Circumference --      Peak Flow --      Pain Score 05/15/18 1019 0     Pain Loc --      Pain Edu? --      Excl. in GC? --     Constitutional: Alert and oriented. No acute distress. Pleasant and interactive Eyes: Conjunctivae are normal.  PERRLA, left-sided nystagmus Head: Atraumatic. Ears: Normal TMs bilaterally Mouth/Throat: Mucous membranes are moist.   Neck:  Painless ROM Cardiovascular: Normal rate, regular rhythm. Grossly normal heart sounds.  Good peripheral circulation. Respiratory: Normal respiratory effort.  No retractions.    Musculoskeletal:  Warm and well perfused Neurologic:  Normal speech and language. No gross focal neurologic deficits are appreciated.  Skin:  Skin is warm, dry and intact.  No rash noted. Psychiatric: Mood and affect are normal. Speech and behavior are normal.  ____________________________________________   LABS (all labs ordered are listed, but only abnormal results are displayed)  Labs Reviewed  BASIC METABOLIC PANEL - Abnormal; Notable for the following components:      Result Value   Glucose, Bld 125 (*)    All other components within normal limits  URINALYSIS, COMPLETE (UACMP) WITH MICROSCOPIC - Abnormal; Notable for the following components:   Color, Urine AMBER (*)    APPearance HAZY (*)    Protein, ur 30 (*)    All other components  within normal limits  CBC   ____________________________________________  EKG  ED ECG REPORT I, Jene Everyobert Vivica Dobosz, the attending physician, personally viewed and interpreted this ECG.  Date: 05/15/2018  Rhythm: normal sinus rhythm QRS Axis: normal Intervals: normal ST/T Wave abnormalities: normal Narrative Interpretation: no evidence of acute ischemia  ____________________________________________  RADIOLOGY  None ____________________________________________   PROCEDURES  Procedure(s) performed: No  Procedures   Critical Care performed: No ____________________________________________   INITIAL IMPRESSION / ASSESSMENT AND PLAN / ED COURSE  Pertinent labs & imaging results that were available during my care of the patient were reviewed by me and considered in my medical decision making (see chart for details).   Patient presents with symptoms most consistent with vertigo, likely peripheral.  Will treat with IV fluids, low-dose benzo as well as meclizine.  Discussed with him positional exercises to treat BPV and the need for outpatient follow-up with ENT as well   ____________________________________________   FINAL CLINICAL IMPRESSION(S) / ED DIAGNOSES  Final diagnoses:  Vertigo        Note:  This document was prepared using Dragon voice recognition software and may include unintentional dictation errors.    Jene EveryKinner, Khadir, MD 05/15/18 [phone]

## 2019-04-10 ENCOUNTER — Other Ambulatory Visit: Payer: Self-pay

## 2019-04-10 ENCOUNTER — Telehealth: Payer: Self-pay

## 2019-04-10 DIAGNOSIS — Z20822 Contact with and (suspected) exposure to covid-19: Secondary | ICD-10-CM

## 2019-04-10 NOTE — Telephone Encounter (Signed)
[facility] calling to schedule COVID 19 testing.

## 2019-04-13 LAB — NOVEL CORONAVIRUS, NAA: SARS-CoV-2, NAA: NOT DETECTED

## 2019-09-08 ENCOUNTER — Encounter: Payer: Self-pay | Admitting: Emergency Medicine

## 2019-09-08 ENCOUNTER — Emergency Department
Admission: EM | Admit: 2019-09-08 | Discharge: 2019-09-08 | Disposition: A | Discharge status: Home / Self Care | Payer: Worker's Compensation | Attending: Emergency Medicine | Admitting: Emergency Medicine

## 2019-09-08 ENCOUNTER — Emergency Department: Payer: Self-pay | Attending: Physician Assistant

## 2019-09-08 ENCOUNTER — Other Ambulatory Visit: Payer: Self-pay

## 2019-09-08 DIAGNOSIS — F17210 Nicotine dependence, cigarettes, uncomplicated: Secondary | ICD-10-CM | POA: Diagnosis not present

## 2019-09-08 DIAGNOSIS — M25522 Pain in left elbow: Secondary | ICD-10-CM | POA: Insufficient documentation

## 2019-09-08 DIAGNOSIS — Y990 Civilian activity done for income or pay: Secondary | ICD-10-CM | POA: Diagnosis not present

## 2019-09-08 DIAGNOSIS — M545 Low back pain: Secondary | ICD-10-CM | POA: Diagnosis not present

## 2019-09-08 MED ORDER — CYCLOBENZAPRINE HCL 10 MG PO TABS: 10.0000 mg | ORAL_TABLET | Freq: Three times a day (TID) | ORAL | 0 refills | Status: DC | PRN

## 2019-09-08 MED ORDER — CYCLOBENZAPRINE HCL 10 MG PO TABS: 10.0000 mg | ORAL_TABLET | Freq: Once | ORAL | Status: DC

## 2019-09-08 MED ORDER — IBUPROFEN 600 MG PO TABS
600.0000 mg | ORAL_TABLET | Freq: Once | ORAL | Status: AC
  Administered 2019-09-08: 600 mg via ORAL
  Filled 2019-09-08: qty 1

## 2019-09-08 MED ORDER — IBUPROFEN 600 MG PO TABS: 600.0000 mg | ORAL_TABLET | Freq: Three times a day (TID) | ORAL | 0 refills | Status: DC | PRN
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
## 2019-10-02 ENCOUNTER — Other Ambulatory Visit: Payer: Self-pay

## 2019-10-02 DIAGNOSIS — Z20822 Contact with and (suspected) exposure to covid-19: Secondary | ICD-10-CM

## 2019-10-03 LAB — NOVEL CORONAVIRUS, NAA: SARS-CoV-2, NAA: NOT DETECTED

## 2019-10-26 ENCOUNTER — Other Ambulatory Visit: Payer: Self-pay

## 2019-10-26 ENCOUNTER — Encounter: Payer: Self-pay | Admitting: Gerontology

## 2019-10-26 ENCOUNTER — Ambulatory Visit: Payer: Self-pay | Admitting: Gerontology

## 2019-10-26 VITALS — BP 148/96 | HR 88 | Ht 71.0 in | Wt 271.0 lb

## 2019-10-26 DIAGNOSIS — Z7689 Persons encountering health services in other specified circumstances: Secondary | ICD-10-CM

## 2019-10-26 DIAGNOSIS — R030 Elevated blood-pressure reading, without diagnosis of hypertension: Secondary | ICD-10-CM

## 2019-10-26 DIAGNOSIS — M25552 Pain in left hip: Secondary | ICD-10-CM | POA: Insufficient documentation
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
## 2019-10-26 NOTE — Patient Instructions (Signed)

## 2019-11-02 ENCOUNTER — Other Ambulatory Visit: Payer: Self-pay

## 2019-11-02 DIAGNOSIS — Z7689 Persons encountering health services in other specified circumstances: Secondary | ICD-10-CM

## 2019-11-03 LAB — LIPID PANEL
Chol/HDL Ratio: 4.3 ratio (ref 0.0–5.0)
Cholesterol, Total: 162 mg/dL (ref 100–199)
HDL: 38 mg/dL — ABNORMAL LOW (ref >39)
LDL Chol Calc (NIH): 94 mg/dL (ref 0–99)
Triglycerides: 171 mg/dL — ABNORMAL HIGH (ref 0–149)
VLDL Cholesterol Cal: 30 mg/dL (ref 5–40)

## 2019-11-03 LAB — CBC WITH DIFFERENTIAL/PLATELET
Basophils Absolute: 0 10*3/uL (ref 0.0–0.2)
Basos: 0 %
EOS (ABSOLUTE): 0.1 10*3/uL (ref 0.0–0.4)
Eos: 1 %
Hematocrit: 41.2 % (ref 37.5–51.0)
Hemoglobin: 14.3 g/dL (ref 13.0–17.7)
Immature Grans (Abs): 0 10*3/uL (ref 0.0–0.1)
Immature Granulocytes: 0 %
Lymphocytes Absolute: 4 10*3/uL — ABNORMAL HIGH (ref 0.7–3.1)
Lymphs: 53 %
MCH: 31 pg (ref 26.6–33.0)
MCHC: 34.7 g/dL (ref 31.5–35.7)
MCV: 89 fL (ref 79–97)
Monocytes Absolute: 0.7 10*3/uL (ref 0.1–0.9)
Monocytes: 9 %
Neutrophils Absolute: 2.8 10*3/uL (ref 1.4–7.0)
Neutrophils: 37 %
Platelets: 279 10*3/uL (ref 150–450)
RBC: 4.61 x10E6/uL (ref 4.14–5.80)
RDW: 12.9 % (ref 11.6–15.4)
WBC: 7.7 10*3/uL (ref 3.4–10.8)

## 2019-11-03 LAB — COMPREHENSIVE METABOLIC PANEL
ALT: 24 IU/L (ref 0–44)
AST: 24 IU/L (ref 0–40)
Albumin/Globulin Ratio: 1.7 (ref 1.2–2.2)
Albumin: 4.3 g/dL (ref 4.0–5.0)
Alkaline Phosphatase: 65 IU/L (ref 39–117)
BUN/Creatinine Ratio: 15 (ref 9–20)
BUN: 16 mg/dL (ref 6–24)
Bilirubin Total: <0.2 mg/dL (ref 0.0–1.2)
CO2: 22 mmol/L (ref 20–29)
Calcium: 9.2 mg/dL (ref 8.7–10.2)
Chloride: 101 mmol/L (ref 96–106)
Creatinine, Ser: 1.05 mg/dL (ref 0.76–1.27)
GFR calc Af Amer: 97 mL/min/{1.73_m2} (ref >59)
GFR calc non Af Amer: 84 mL/min/{1.73_m2} (ref >59)
Globulin, Total: 2.6 g/dL (ref 1.5–4.5)
Glucose: 108 mg/dL — ABNORMAL HIGH (ref 65–99)
Potassium: 4.2 mmol/L (ref 3.5–5.2)
Sodium: 136 mmol/L (ref 134–144)
Total Protein: 6.9 g/dL (ref 6.0–8.5)

## 2019-11-03 LAB — HEMOGLOBIN A1C
Est. average glucose Bld gHb Est-mCnc: 120 mg/dL
Hgb A1c MFr Bld: 5.8 % — ABNORMAL HIGH (ref 4.8–5.6)

## 2019-11-03 LAB — TSH: TSH: 2.36 u[IU]/mL (ref 0.450–4.500)

## 2019-11-14 ENCOUNTER — Ambulatory Visit: Payer: Self-pay | Admitting: Gerontology

## 2019-11-14 ENCOUNTER — Other Ambulatory Visit: Payer: Self-pay

## 2019-11-14 VITALS — BP 126/78 | HR 92 | Ht 71.0 in | Wt 268.0 lb

## 2019-11-14 DIAGNOSIS — R030 Elevated blood-pressure reading, without diagnosis of hypertension: Secondary | ICD-10-CM

## 2019-11-14 DIAGNOSIS — R7303 Prediabetes: Secondary | ICD-10-CM | POA: Insufficient documentation

## 2019-11-14 DIAGNOSIS — M25552 Pain in left hip: Secondary | ICD-10-CM

## 2019-11-14 NOTE — Progress Notes (Signed)
Established Patient Office Visit  Subjective:  Patient ID: Scott Morrison, male    DOB: 1971-06-09  Age: 48 y.o. MRN: [id number]  CC:  Chief Complaint  Patient presents with  . Hypertension    HPI Scott Morrison presents for hypertension and lab review.  He reports checking his blood pressure at home and is usually less than 130/80, but he forgot to bring his log to his appointment.  He states that he adheres to DASH diet, and exercises as tolerated.  His HgbA1c done on 11/02/2019 was 5.8%, Lipid panel, Triglyceride was 171 mg/dl and HDL was 38 mg/dl.  He states that he continues to experience intermittent pain to his left hip, and taking Aleve relieves symptoms.  He denies motor weakness, bladder or bowel incontinence and saddle anesthesia.  He states that he will follow-up with the Orthopedic Surgeon Dr. Justice Rocher on 12/12/2019.  He denies chest pain, palpitation, lightheadedness, fever and chills.  He states that he is doing well and offers no further complaints.  Past Medical History:  Diagnosis Date  . Renal disorder     No past surgical history on file.  No family history on file.  Social History   Socioeconomic History  . Marital status: Married    Spouse name: Not on file  . Number of children: Not on file  . Years of education: Not on file  . Highest education level: Not on file  Occupational History  . Not on file  Tobacco Use  . Smoking status: Former Smoker    Packs/day: 1.00    Types: Cigarettes  . Smokeless tobacco: Current User    Types: Chew  . Tobacco comment: quit 3 years ago  Substance and Sexual Activity  . Alcohol use: Yes    Comment: occasionally  . Drug use: No  . Sexual activity: Not on file  Other Topics Concern  . Not on file  Social History Narrative  . Not on file   Social Determinants of Health   Financial Resource Strain:   . Difficulty of Paying Living Expenses: Not on file  Food Insecurity:   . Worried About Programme researcher, broadcasting/film/video  in the Last Year: Not on file  . Ran Out of Food in the Last Year: Not on file  Transportation Needs:   . Lack of Transportation (Medical): Not on file  . Lack of Transportation (Non-Medical): Not on file  Physical Activity:   . Days of Exercise per Week: Not on file  . Minutes of Exercise per Session: Not on file  Stress:   . Feeling of Stress : Not on file  Social Connections:   . Frequency of Communication with Friends and Family: Not on file  . Frequency of Social Gatherings with Friends and Family: Not on file  . Attends Religious Services: Not on file  . Active Member of Clubs or Organizations: Not on file  . Attends Banker Meetings: Not on file  . Marital Status: Not on file  Intimate Partner Violence:   . Fear of Current or Ex-Partner: Not on file  . Emotionally Abused: Not on file  . Physically Abused: Not on file  . Sexually Abused: Not on file    No outpatient medications prior to visit.   No facility-administered medications prior to visit.    No Known Allergies  ROS Review of Systems  Constitutional: Negative.   Eyes: Negative.   Respiratory: Negative.   Cardiovascular: Negative.   Genitourinary: Negative.  Musculoskeletal: Positive for arthralgias (left hip pain).  Neurological: Negative.   Psychiatric/Behavioral: Negative.       Objective:    Physical Exam  Constitutional: He is oriented to person, place, and time. He appears well-developed.  HENT:  Head: Normocephalic and atraumatic.  Cardiovascular: Normal rate and regular rhythm.  Pulmonary/Chest: Effort normal and breath sounds normal.  Neurological: He is alert and oriented to person, place, and time.  Skin: Skin is warm and dry.  Psychiatric: He has a normal mood and affect. His behavior is normal. Judgment and thought content normal.    BP 126/78 (BP Location: Left Arm, Patient Position: Sitting)   Pulse 92   Ht 5\' 11"  (1.803 m)   Wt 268 lb (121.6 kg)   SpO2 95%   BMI  37.38 kg/m  Wt Readings from Last 3 Encounters:  11/14/19 268 lb (121.6 kg)  10/26/19 271 lb (122.9 kg)  09/08/19 260 lb (117.9 kg)   He lost 3 pounds and was encouraged to continue on his weight loss regimen.   Health Maintenance Due  Topic Date Due  . HIV Screening  07/11/1986  . TETANUS/TDAP  07/11/1990    There are no preventive care reminders to display for this patient.  Lab Results  Component Value Date   TSH 2.360 11/02/2019   Lab Results  Component Value Date   WBC 7.7 11/02/2019   HGB 14.3 11/02/2019   HCT 41.2 11/02/2019   MCV 89 11/02/2019   PLT 279 11/02/2019   Lab Results  Component Value Date   NA 136 11/02/2019   K 4.2 11/02/2019   CO2 22 11/02/2019   GLUCOSE 108 (H) 11/02/2019   BUN 16 11/02/2019   CREATININE 1.05 11/02/2019   BILITOT <0.2 11/02/2019   ALKPHOS 65 11/02/2019   AST 24 11/02/2019   ALT 24 11/02/2019   PROT 6.9 11/02/2019   ALBUMIN 4.3 11/02/2019   CALCIUM 9.2 11/02/2019   ANIONGAP 8 05/15/2018   Lab Results  Component Value Date   CHOL 162 11/02/2019   Lab Results  Component Value Date   HDL 38 (L) 11/02/2019   Lab Results  Component Value Date   LDLCALC 94 11/02/2019   Lab Results  Component Value Date   TRIG 171 (H) 11/02/2019   Lab Results  Component Value Date   CHOLHDL 4.3 11/02/2019   Lab Results  Component Value Date   HGBA1C 5.8 (H) 11/02/2019      Assessment & Plan:    1. Prediabetes -His hemoglobin A1c was 5.8%, he defers Metformin therapy and states that he is going to make some lifestyle notifications.  He was advised to continue on low carbohydrate/no concentrated sweet diet. -We will recheck HgB A1c in the Future.  2. Elevated blood pressure reading -His blood pressure elevated, and he was advised to continue on DASH diet and exercise as tolerated.  He was advised to monitor his blood pressure, record and bring the log to follow-up visit.  3. Left hip pain -He was encouraged to keep his  appointment with the orthopedic surgeon Dr. Vickki Hearing on 12/12/2019.    Follow-up: Return in about 6 months (around 05/08/2020), or if symptoms worsen or fail to improve.    Edson Deridder Jerold Coombe, NP

## 2019-11-14 NOTE — Patient Instructions (Signed)
Carbohydrate Counting for Diabetes Mellitus, Adult  Carbohydrate counting is a method of keeping track of how many carbohydrates you eat. Eating carbohydrates naturally increases the amount of sugar (glucose) in the blood. Counting how many carbohydrates you eat helps keep your blood glucose within normal limits, which helps you manage your diabetes (diabetes mellitus). It is important to know how many carbohydrates you can safely have in each meal. This is different for every person. A diet and nutrition specialist (registered dietitian) can help you make a meal plan and calculate how many carbohydrates you should have at each meal and snack. Carbohydrates are found in the following foods:  Grains, such as breads and cereals.  Dried beans and soy products.  Starchy vegetables, such as potatoes, peas, and corn.  Fruit and fruit juices.  Milk and yogurt.  Sweets and snack foods, such as cake, cookies, candy, chips, and soft drinks. How do I count carbohydrates? There are two ways to count carbohydrates in food. You can use either of the methods or a combination of both. Reading "Nutrition Facts" on packaged food The "Nutrition Facts" list is included on the labels of almost all packaged foods and beverages in the [place] It includes:  The serving size.  Information about nutrients in each serving, including the grams (g) of carbohydrate per serving. To use the "Nutrition Facts":  Decide how many servings you will have.  Multiply the number of servings by the number of carbohydrates per serving.  The resulting number is the total amount of carbohydrates that you will be having. Learning standard serving sizes of other foods When you eat carbohydrate foods that are not packaged or do not include "Nutrition Facts" on the label, you need to measure the servings in order to count the amount of carbohydrates:  Measure the foods that you will eat with a food scale or measuring cup, if needed.   Decide how many standard-size servings you will eat.  Multiply the number of servings by 15. Most carbohydrate-rich foods have about 15 g of carbohydrates per serving. ? For example, if you eat 8 oz (170 g) of strawberries, you will have eaten 2 servings and 30 g of carbohydrates (2 servings x 15 g = 30 g).  For foods that have more than one food mixed, such as soups and casseroles, you must count the carbohydrates in each food that is included. The following list contains standard serving sizes of common carbohydrate-rich foods. Each of these servings has about 15 g of carbohydrates:   hamburger bun or  English muffin.   oz (15 mL) syrup.   oz (14 g) jelly.  1 slice of bread.  1 six-inch tortilla.  3 oz (85 g) cooked rice or pasta.  4 oz (113 g) cooked dried beans.  4 oz (113 g) starchy vegetable, such as peas, corn, or potatoes.  4 oz (113 g) hot cereal.  4 oz (113 g) mashed potatoes or  of a large baked potato.  4 oz (113 g) canned or frozen fruit.  4 oz (120 mL) fruit juice.  4-6 crackers.  6 chicken nuggets.  6 oz (170 g) unsweetened dry cereal.  6 oz (170 g) plain fat-free yogurt or yogurt sweetened with artificial sweeteners.  8 oz (240 mL) milk.  8 oz (170 g) fresh fruit or one small piece of fruit.  24 oz (680 g) popped popcorn. Example of carbohydrate counting Sample meal  3 oz (85 g) chicken breast.  6 oz (170 g)   brown rice.  4 oz (113 g) corn.  8 oz (240 mL) milk.  8 oz (170 g) strawberries with sugar-free whipped topping. Carbohydrate calculation 1. Identify the foods that contain carbohydrates: ? Rice. ? Corn. ? Milk. ? Strawberries. 2. Calculate how many servings you have of each food: ? 2 servings rice. ? 1 serving corn. ? 1 serving milk. ? 1 serving strawberries. 3. Multiply each number of servings by 15 g: ? 2 servings rice x 15 g = 30 g. ? 1 serving corn x 15 g = 15 g. ? 1 serving milk x 15 g = 15 g. ? 1 serving  strawberries x 15 g = 15 g. 4. Add together all of the amounts to find the total grams of carbohydrates eaten: ? 30 g + 15 g + 15 g + 15 g = 75 g of carbohydrates total. Summary  Carbohydrate counting is a method of keeping track of how many carbohydrates you eat.  Eating carbohydrates naturally increases the amount of sugar (glucose) in the blood.  Counting how many carbohydrates you eat helps keep your blood glucose within normal limits, which helps you manage your diabetes.  A diet and nutrition specialist (registered dietitian) can help you make a meal plan and calculate how many carbohydrates you should have at each meal and snack. This information is not intended to replace advice given to you by your health care provider. Make sure you discuss any questions you have with your health care provider. Document Released: 11/09/2005 Document Revised: 06/03/2017 Document Reviewed: 04/22/2016 Elsevier Patient Education  2020 [employer] DASH Eating Plan DASH stands for "Dietary Approaches to Stop Hypertension." The DASH eating plan is a healthy eating plan that has been shown to reduce high blood pressure (hypertension). It may also reduce your risk for type 2 diabetes, heart disease, and stroke. The DASH eating plan may also help with weight loss. What are tips for following this plan?  General guidelines  Avoid eating more than 2,300 mg (milligrams) of salt (sodium) a day. If you have hypertension, you may need to reduce your sodium intake to 1,500 mg a day.  Limit alcohol intake to no more than 1 drink a day for nonpregnant women and 2 drinks a day for men. One drink equals 12 oz of beer, 5 oz of wine, or 1 oz of hard liquor.  Work with your health care provider to maintain a healthy body weight or to lose weight. Ask what an ideal weight is for you.  Get at least 30 minutes of exercise that causes your heart to beat faster (aerobic exercise) most days of the week. Activities may  include walking, swimming, or biking.  Work with your health care provider or diet and nutrition specialist (dietitian) to adjust your eating plan to your individual calorie needs. Reading food labels   Check food labels for the amount of sodium per serving. Choose foods with less than 5 percent of the Daily Value of sodium. Generally, foods with less than 300 mg of sodium per serving fit into this eating plan.  To find whole grains, look for the word "whole" as the first word in the ingredient list. Shopping  Buy products labeled as "low-sodium" or "no salt added."  Buy fresh foods. Avoid canned foods and premade or frozen meals. Cooking  Avoid adding salt when cooking. Use salt-free seasonings or herbs instead of table salt or sea salt. Check with your health care provider or pharmacist before using salt substitutes.    Do not fry foods. Cook foods using healthy methods such as baking, boiling, grilling, and broiling instead.  Cook with heart-healthy oils, such as olive, canola, soybean, or sunflower oil. Meal planning  Eat a balanced diet that includes: ? 5 or more servings of fruits and vegetables each day. At each meal, try to fill half of your plate with fruits and vegetables. ? Up to 6-8 servings of whole grains each day. ? Less than 6 oz of lean meat, poultry, or fish each day. A 3-oz serving of meat is about the same size as a deck of cards. One egg equals 1 oz. ? 2 servings of low-fat dairy each day. ? A serving of nuts, seeds, or beans 5 times each week. ? Heart-healthy fats. Healthy fats called Omega-3 fatty acids are found in foods such as flaxseeds and coldwater fish, like sardines, salmon, and mackerel.  Limit how much you eat of the following: ? Canned or prepackaged foods. ? Food that is high in trans fat, such as fried foods. ? Food that is high in saturated fat, such as fatty meat. ? Sweets, desserts, sugary drinks, and other foods with added sugar. ? Full-fat  dairy products.  Do not salt foods before eating.  Try to eat at least 2 vegetarian meals each week.  Eat more home-cooked food and less restaurant, buffet, and fast food.  When eating at a restaurant, ask that your food be prepared with less salt or no salt, if possible. What foods are recommended? The items listed may not be a complete list. Talk with your dietitian about what dietary choices are best for you. Grains Whole-grain or whole-wheat bread. Whole-grain or whole-wheat pasta. Brown rice. Oatmeal. Quinoa. Bulgur. Whole-grain and low-sodium cereals. Pita bread. Low-fat, low-sodium crackers. Whole-wheat flour tortillas. Vegetables Fresh or frozen vegetables (raw, steamed, roasted, or grilled). Low-sodium or reduced-sodium tomato and vegetable juice. Low-sodium or reduced-sodium tomato sauce and tomato paste. Low-sodium or reduced-sodium canned vegetables. Fruits All fresh, dried, or frozen fruit. Canned fruit in natural juice (without added sugar). Meat and other protein foods Skinless chicken or turkey. Ground chicken or turkey. Pork with fat trimmed off. Fish and seafood. Egg whites. Dried beans, peas, or lentils. Unsalted nuts, nut butters, and seeds. Unsalted canned beans. Lean cuts of beef with fat trimmed off. Low-sodium, lean deli meat. Dairy Low-fat (1%) or fat-free (skim) milk. Fat-free, low-fat, or reduced-fat cheeses. Nonfat, low-sodium ricotta or cottage cheese. Low-fat or nonfat yogurt. Low-fat, low-sodium cheese. Fats and oils Soft margarine without trans fats. Vegetable oil. Low-fat, reduced-fat, or light mayonnaise and salad dressings (reduced-sodium). Canola, safflower, olive, soybean, and sunflower oils. Avocado. Seasoning and other foods Herbs. Spices. Seasoning mixes without salt. Unsalted popcorn and pretzels. Fat-free sweets. What foods are not recommended? The items listed may not be a complete list. Talk with your dietitian about what dietary choices are best  for you. Grains Baked goods made with fat, such as croissants, muffins, or some breads. Dry pasta or rice meal packs. Vegetables Creamed or fried vegetables. Vegetables in a cheese sauce. Regular canned vegetables (not low-sodium or reduced-sodium). Regular canned tomato sauce and paste (not low-sodium or reduced-sodium). Regular tomato and vegetable juice (not low-sodium or reduced-sodium). Pickles. Olives. Fruits Canned fruit in a light or heavy syrup. Fried fruit. Fruit in cream or butter sauce. Meat and other protein foods Fatty cuts of meat. Ribs. Fried meat. Bacon. Sausage. Bologna and other processed lunch meats. Salami. Fatback. Hotdogs. Bratwurst. Salted nuts and seeds. Canned beans with   added salt. Canned or smoked fish. Whole eggs or egg yolks. Chicken or turkey with skin. Dairy Whole or 2% milk, cream, and half-and-half. Whole or full-fat cream cheese. Whole-fat or sweetened yogurt. Full-fat cheese. Nondairy creamers. Whipped toppings. Processed cheese and cheese spreads. Fats and oils Butter. Stick margarine. Lard. Shortening. Ghee. Bacon fat. Tropical oils, such as coconut, palm kernel, or palm oil. Seasoning and other foods Salted popcorn and pretzels. Onion salt, garlic salt, seasoned salt, table salt, and sea salt. Worcestershire sauce. Tartar sauce. Barbecue sauce. Teriyaki sauce. Soy sauce, including reduced-sodium. Steak sauce. Canned and packaged gravies. Fish sauce. Oyster sauce. Cocktail sauce. Horseradish that you find on the shelf. Ketchup. Mustard. Meat flavorings and tenderizers. Bouillon cubes. Hot sauce and Tabasco sauce. Premade or packaged marinades. Premade or packaged taco seasonings. Relishes. Regular salad dressings. Where to find more information:  National Heart, Lung, and Blood Institute: www.nhlbi.nih.gov  American Heart Association: www.heart.org Summary  The DASH eating plan is a healthy eating plan that has been shown to reduce high blood pressure  (hypertension). It may also reduce your risk for type 2 diabetes, heart disease, and stroke.  With the DASH eating plan, you should limit salt (sodium) intake to 2,300 mg a day. If you have hypertension, you may need to reduce your sodium intake to 1,500 mg a day.  When on the DASH eating plan, aim to eat more fresh fruits and vegetables, whole grains, lean proteins, low-fat dairy, and heart-healthy fats.  Work with your health care provider or diet and nutrition specialist (dietitian) to adjust your eating plan to your individual calorie needs. This information is not intended to replace advice given to you by your health care provider. Make sure you discuss any questions you have with your health care provider. Document Released: 10/29/2011 Document Revised: 10/22/2017 Document Reviewed: 11/02/2016 Elsevier Patient Education  2020 Elsevier Inc.  

## 2019-12-12 ENCOUNTER — Ambulatory Visit: Payer: Self-pay | Admitting: Specialist

## 2019-12-19 ENCOUNTER — Other Ambulatory Visit: Payer: Self-pay

## 2019-12-19 ENCOUNTER — Ambulatory Visit: Payer: Self-pay | Admitting: Specialist

## 2019-12-19 DIAGNOSIS — M25552 Pain in left hip: Secondary | ICD-10-CM
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
## 2020-03-07 ENCOUNTER — Other Ambulatory Visit: Payer: Self-pay

## 2020-03-07 ENCOUNTER — Ambulatory Visit: Payer: Medicaid Other | Admitting: Urology

## 2020-03-07 VITALS — BP 131/83 | HR 72 | Temp 97.7°F | Ht 71.0 in | Wt 243.0 lb

## 2020-03-07 DIAGNOSIS — R030 Elevated blood-pressure reading, without diagnosis of hypertension: Secondary | ICD-10-CM

## 2020-03-07 DIAGNOSIS — M25552 Pain in left hip: Secondary | ICD-10-CM

## 2020-03-07 DIAGNOSIS — R7303 Prediabetes: Secondary | ICD-10-CM

## 2020-03-07 MED ORDER — LISINOPRIL 10 MG PO TABS: 10.0000 mg | ORAL_TABLET | Freq: Every day | ORAL | 0 refills | Status: DC

## 2020-03-07 NOTE — Progress Notes (Signed)
  Patient: Scott Morrison Male    DOB: March 06, 1971   49 y.o.   MRN: [id number] Visit Date: 03/07/2020  Today's Provider: [facility]   Chief Complaint  Patient presents with  . 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 Has a blood pressure machine he's using. Possibly wants to be put on medcation. Takes it every other day, normally after work around 12-1pm.   Subjective:    HPI  HTN  Employer is wanting tighter control of blood pressure  Prediabetic  Hgb A1c pending  No Known Allergies Previous Medications   No medications on file    Review of Systems  Social History   Tobacco Use  . Smoking status: Former Smoker    Packs/day: 1.00    Types: Cigarettes  . Smokeless tobacco: Current User    Types: Chew  . Tobacco comment: quit 3 years ago  Substance Use Topics  . Alcohol use: Yes    Alcohol/week: 3.0 standard drinks    Types: 3 Standard drinks or equivalent per week    Comment: occasionally   Objective:   BP 131/83   Pulse 72   Temp 97.7 F (36.5 C)   Ht 5\' 11"  (1.803 m) Comment: as reported by patient  Wt 243 lb (110.2 kg)   SpO2 91%   BMI 33.89 kg/m   Physical Exam      Assessment & Plan:   1. Prediabetes - HgB A1c  2. Elevated blood pressure reading - start lisinopril 10 mg titrate up by 10 mg until blood pressure is under control - max 40 mg  3. Left hip pain - resolved    [facility]   [facility]

## 2020-03-08 LAB — HEMOGLOBIN A1C
Est. average glucose Bld gHb Est-mCnc: 117 mg/dL
Hgb A1c MFr Bld: 5.7 % — ABNORMAL HIGH (ref 4.8–5.6)

## 2020-04-04 ENCOUNTER — Ambulatory Visit: Payer: Medicaid Other

## 2020-04-10 ENCOUNTER — Ambulatory Visit: Payer: Medicaid Other | Admitting: Internal Medicine

## 2020-05-01 ENCOUNTER — Other Ambulatory Visit: Payer: Medicaid Other

## 2020-05-01 ENCOUNTER — Other Ambulatory Visit: Payer: Self-pay

## 2020-05-01 DIAGNOSIS — R030 Elevated blood-pressure reading, without diagnosis of hypertension: Secondary | ICD-10-CM

## 2020-05-01 NOTE — Progress Notes (Unsigned)
cbc

## 2020-05-02 LAB — COMPREHENSIVE METABOLIC PANEL
ALT: 19 IU/L (ref 0–44)
AST: 18 IU/L (ref 0–40)
Albumin/Globulin Ratio: 1.6 (ref 1.2–2.2)
Albumin: 4.1 g/dL (ref 4.0–5.0)
Alkaline Phosphatase: 60 IU/L (ref 48–121)
BUN/Creatinine Ratio: 24 — ABNORMAL HIGH (ref 9–20)
BUN: 20 mg/dL (ref 6–24)
Bilirubin Total: 0.2 mg/dL (ref 0.0–1.2)
CO2: 18 mmol/L — ABNORMAL LOW (ref 20–29)
Calcium: 8.7 mg/dL (ref 8.7–10.2)
Chloride: 106 mmol/L (ref 96–106)
Creatinine, Ser: 0.84 mg/dL (ref 0.76–1.27)
GFR calc Af Amer: 120 mL/min/{1.73_m2} (ref >59)
GFR calc non Af Amer: 104 mL/min/{1.73_m2} (ref >59)
Globulin, Total: 2.5 g/dL (ref 1.5–4.5)
Glucose: 139 mg/dL — ABNORMAL HIGH (ref 65–99)
Potassium: 4.4 mmol/L (ref 3.5–5.2)
Sodium: 139 mmol/L (ref 134–144)
Total Protein: 6.6 g/dL (ref 6.0–8.5)

## 2020-05-02 LAB — CBC WITH DIFFERENTIAL/PLATELET
Basophils Absolute: 0 10*3/uL (ref 0.0–0.2)
Basos: 1 %
EOS (ABSOLUTE): 0 10*3/uL (ref 0.0–0.4)
Eos: 1 %
Hematocrit: 44.3 % (ref 37.5–51.0)
Hemoglobin: 15.1 g/dL (ref 13.0–17.7)
Immature Grans (Abs): 0 10*3/uL (ref 0.0–0.1)
Immature Granulocytes: 0 %
Lymphocytes Absolute: 2.5 10*3/uL (ref 0.7–3.1)
Lymphs: 56 %
MCH: 29.8 pg (ref 26.6–33.0)
MCHC: 34.1 g/dL (ref 31.5–35.7)
MCV: 88 fL (ref 79–97)
Monocytes Absolute: 0.3 10*3/uL (ref 0.1–0.9)
Monocytes: 8 %
Neutrophils Absolute: 1.5 10*3/uL (ref 1.4–7.0)
Neutrophils: 34 %
Platelets: 246 10*3/uL (ref 150–450)
RBC: 5.06 x10E6/uL (ref 4.14–5.80)
RDW: 13.2 % (ref 11.6–15.4)
WBC: 4.3 10*3/uL (ref 3.4–10.8)

## 2020-05-02 LAB — LIPID PANEL
Chol/HDL Ratio: 3.7 ratio (ref 0.0–5.0)
Cholesterol, Total: 137 mg/dL (ref 100–199)
HDL: 37 mg/dL — ABNORMAL LOW (ref >39)
LDL Chol Calc (NIH): 88 mg/dL (ref 0–99)
Triglycerides: 57 mg/dL (ref 0–149)
VLDL Cholesterol Cal: 12 mg/dL (ref 5–40)

## 2020-05-08 ENCOUNTER — Other Ambulatory Visit: Payer: Self-pay

## 2020-05-08 ENCOUNTER — Ambulatory Visit: Payer: Medicaid Other | Admitting: Gerontology

## 2020-05-08 VITALS — BP 106/71 | HR 75 | Ht 71.0 in | Wt 243.0 lb

## 2020-05-08 DIAGNOSIS — M25511 Pain in right shoulder: Secondary | ICD-10-CM

## 2020-05-08 DIAGNOSIS — I10 Essential (primary) hypertension: Secondary | ICD-10-CM

## 2020-05-08 MED ORDER — LISINOPRIL 10 MG PO TABS: 10.0000 mg | ORAL_TABLET | Freq: Every day | ORAL | 1 refills | Status: AC

## 2020-05-08 NOTE — Progress Notes (Signed)
Established Patient Office Visit  Subjective:  Patient ID: Scott Morrison, male    DOB: [birth year]  Age: 49 y.o. MRN: [id number]  CC:  Chief Complaint  Patient presents with  . Hypertension  . Shoulder Pain    1.5 months of pain, difficulty lifting above head    HPI Scott Morrison presents for follow up of hypertension and lab review. He's compliant with his medications, and continues on DASH diet. His lab done on 05/01/2020, HDL was 37 mg/dl. He states that he continues to experiences intermittent tight non radiating 4/10 pain to his right shoulder. He states that it started a month ago. He states that the tightness occurs when he over extends his right shoulder and lifting objects. He denies any injury, numbness, muscle weakness and states that he has not taking any medication. He states that when he stops activity, tightness resolves. Overall, he states that he's doing well and offers no further complaint.  Past Medical History:  Diagnosis Date  . Renal disorder     No past surgical history on file.  No family history on file.  Social History   Socioeconomic History  . Marital status: Single    Spouse name: Not on file  . Number of children: 2  . Years of education: Not on file  . Highest education level: Some college, no degree  Occupational History  . Not on file  Tobacco Use  . Smoking status: Former Smoker    Packs/day: 1.00    Types: Cigarettes  . Smokeless tobacco: Current User    Types: Chew  . Tobacco comment: quit 3 years ago  Vaping Use  . Vaping Use: Never used  Substance and Sexual Activity  . Alcohol use: Yes    Alcohol/week: 3.0 standard drinks    Types: 3 Standard drinks or equivalent per week    Comment: occasionally  . Drug use: No  . Sexual activity: Yes    Birth control/protection: None  Other Topics Concern  . Not on file  Social History Narrative   Has an EBT card which helps with food. Housing is the most difficult to pay for. Has  tried in the past to get help on his mortgage. Scammed about 6k from "law group" when he needed help which put him further behind on his mortgage.    Social Determinants of Health   Financial Resource Strain: Medium Risk  . Difficulty of Paying Living Expenses: Somewhat hard  Food Insecurity: Food Insecurity Present  . Worried About Charity fundraiser in the Last Year: Sometimes true  . Ran Out of Food in the Last Year: Sometimes true  Transportation Needs: No Transportation Needs  . Lack of Transportation (Medical): No  . Lack of Transportation (Non-Medical): No  Physical Activity: Sufficiently Active  . Days of Exercise per Week: 7 days  . Minutes of Exercise per Session: 60 min  Stress: Stress Concern Present  . Feeling of Stress : Rather much  Social Connections: Socially Isolated  . Frequency of Communication with Friends and Family: More than three times a week  . Frequency of Social Gatherings with Friends and Family: More than three times a week  . Attends Religious Services: Never  . Active Member of Clubs or Organizations: No  . Attends Archivist Meetings: Never  . Marital Status: Never married  Intimate Partner Violence: Not At Risk  . Fear of Current or Ex-Partner: No  . Emotionally Abused: No  . Physically Abused:  No  . Sexually Abused: No    Outpatient Medications Prior to Visit  Medication Sig Dispense Refill  . lisinopril (PRINIVIL) 10 MG tablet Take 1 tablet (10 mg total) by mouth daily. 90 tablet 0   No facility-administered medications prior to visit.    No Known Allergies  ROS Review of Systems  Constitutional: Negative.   Eyes: Negative.   Respiratory: Negative.   Cardiovascular: Negative.   Musculoskeletal: Positive for arthralgias (right shoulder tightness).  Neurological: Negative.   Psychiatric/Behavioral: Negative.       Objective:    Physical Exam Constitutional:      Appearance: Normal appearance.  HENT:     Head:  Normocephalic and atraumatic.  Eyes:     Extraocular Movements: Extraocular movements intact.     Pupils: Pupils are equal, round, and reactive to light.  Cardiovascular:     Rate and Rhythm: Normal rate and regular rhythm.  Pulmonary:     Effort: Pulmonary effort is normal.     Breath sounds: Normal breath sounds.  Musculoskeletal:        General: Normal range of motion.  Neurological:     General: No focal deficit present.     Mental Status: He is alert and oriented to person, place, and time. Mental status is at baseline.     BP 106/71 (BP Location: Right Arm, Patient Position: Sitting)   Pulse 75   Ht 5\' 11"  (1.803 m)   Wt 243 lb (110.2 kg)   SpO2 96%   BMI 33.89 kg/m  Wt Readings from Last 3 Encounters:  05/08/20 243 lb (110.2 kg)  03/07/20 243 lb (110.2 kg)  11/14/19 268 lb (121.6 kg)   He was encouraged to continue on his weight loss regimen.  Health Maintenance Due  Topic Date Due  . Hepatitis C Screening  Never done  . COVID-19 Vaccine (1) Never done  . HIV Screening  Never done  . TETANUS/TDAP  Never done    There are no preventive care reminders to display for this patient.  Lab Results  Component Value Date   TSH 2.360 11/02/2019   Lab Results  Component Value Date   WBC 4.3 05/01/2020   HGB 15.1 05/01/2020   HCT 44.3 05/01/2020   MCV 88 05/01/2020   PLT 246 05/01/2020   Lab Results  Component Value Date   NA 139 05/01/2020   K 4.4 05/01/2020   CO2 18 (L) 05/01/2020   GLUCOSE 139 (H) 05/01/2020   BUN 20 05/01/2020   CREATININE 0.84 05/01/2020   BILITOT 0.2 05/01/2020   ALKPHOS 60 05/01/2020   AST 18 05/01/2020   ALT 19 05/01/2020   PROT 6.6 05/01/2020   ALBUMIN 4.1 05/01/2020   CALCIUM 8.7 05/01/2020   ANIONGAP 8 05/15/2018   Lab Results  Component Value Date   CHOL 137 05/01/2020   Lab Results  Component Value Date   HDL 37 (L) 05/01/2020   Lab Results  Component Value Date   LDLCALC 88 05/01/2020   Lab Results  Component  Value Date   TRIG 57 05/01/2020   Lab Results  Component Value Date   CHOLHDL 3.7 05/01/2020   Lab Results  Component Value Date   HGBA1C 5.7 (H) 03/07/2020      Assessment & Plan:    1. Essential hypertension - His blood pressure is under control and he will continue on current treatment regimen. He was advised to continue on DASH diet and exercise as tolerated. - lisinopril (  PRINIVIL) 10 MG tablet; Take 1 tablet (10 mg total) by mouth daily.  Dispense: 90 tablet; Refill: 1    2. Acute pain of right shoulder - He was advised to take otc Tylenol as needed and advised to notify clinic with worsening symptoms.   Follow-up: Return in about 6 months (around 11/07/2020), or if symptoms worsen or fail to improve.    Willadeen Colantuono Trellis Paganini, NP

## 2020-05-08 NOTE — Patient Instructions (Signed)
   Managing Your Hypertension Hypertension is commonly called high blood pressure. This is when the force of your blood pressing against the walls of your arteries is too strong. Arteries are blood vessels that carry blood from your heart throughout your body. Hypertension forces the heart to work harder to pump blood, and may cause the arteries to become narrow or stiff. Having untreated or uncontrolled hypertension can cause heart attack, stroke, kidney disease, and other problems. What are blood pressure readings? A blood pressure reading consists of a higher number over a lower number. Ideally, your blood pressure should be below 120/80. The first ("top") number is called the systolic pressure. It is a measure of the pressure in your arteries as your heart beats. The second ("bottom") number is called the diastolic pressure. It is a measure of the pressure in your arteries as the heart relaxes. What does my blood pressure reading mean? Blood pressure is classified into four stages. Based on your blood pressure reading, your health care provider may use the following stages to determine what type of treatment you need, if any. Systolic pressure and diastolic pressure are measured in a unit called mm Hg. Normal  Systolic pressure: below 120.  Diastolic pressure: below 80. Elevated  Systolic pressure: 120-129.  Diastolic pressure: below 80. Hypertension stage 1  Systolic pressure: 130-139.  Diastolic pressure: 80-89. Hypertension stage 2  Systolic pressure: 140 or above.  Diastolic pressure: 90 or above. What health risks are associated with hypertension? Managing your hypertension is an important responsibility. Uncontrolled hypertension can lead to:  A heart attack.  A stroke.  A weakened blood vessel (aneurysm).  Heart failure.  Kidney damage.  Eye damage.  Metabolic syndrome.  Memory and concentration problems. What changes can I make to manage my  hypertension? Hypertension can be managed by making lifestyle changes and possibly by taking medicines. Your health care provider will help you make a plan to bring your blood pressure within a normal range. Eating and drinking   Eat a diet that is high in fiber and potassium, and low in salt (sodium), added sugar, and fat. An example eating plan is called the DASH (Dietary Approaches to Stop Hypertension) diet. To eat this way: ? Eat plenty of fresh fruits and vegetables. Try to fill half of your plate at each meal with fruits and vegetables. ? Eat whole grains, such as whole wheat pasta, brown rice, or whole grain bread. Fill about one quarter of your plate with whole grains. ? Eat low-fat diary products. ? Avoid fatty cuts of meat, processed or cured meats, and poultry with skin. Fill about one quarter of your plate with lean proteins such as fish, chicken without skin, beans, eggs, and tofu. ? Avoid premade and processed foods. These tend to be higher in sodium, added sugar, and fat.  Reduce your daily sodium intake. Most people with hypertension should eat less than 1,500 mg of sodium a day.  Limit alcohol intake to no more than 1 drink a day for nonpregnant women and 2 drinks a day for men. One drink equals 12 oz of beer, 5 oz of wine, or 1 oz of hard liquor. Lifestyle  Work with your health care provider to maintain a healthy body weight, or to lose weight. Ask what an ideal weight is for you.  Get at least 30 minutes of exercise that causes your heart to beat faster (aerobic exercise) most days of the week. Activities may include walking, swimming, or biking.    Include exercise to strengthen your muscles (resistance exercise), such as weight lifting, as part of your weekly exercise routine. Try to do these types of exercises for 30 minutes at least 3 days a week.  Do not use any products that contain nicotine or tobacco, such as cigarettes and e-cigarettes. If you need help quitting,  ask your health care provider.  Control any long-term (chronic) conditions you have, such as high cholesterol or diabetes. Monitoring  Monitor your blood pressure at home as told by your health care provider. Your personal target blood pressure may vary depending on your medical conditions, your age, and other factors.  Have your blood pressure checked regularly, as often as told by your health care provider. Working with your health care provider  Review all the medicines you take with your health care provider because there may be side effects or interactions.  Talk with your health care provider about your diet, exercise habits, and other lifestyle factors that may be contributing to hypertension.  Visit your health care provider regularly. Your health care provider can help you create and adjust your plan for managing hypertension. Will I need medicine to control my blood pressure? Your health care provider may prescribe medicine if lifestyle changes are not enough to get your blood pressure under control, and if:  Your systolic blood pressure is 130 or higher.  Your diastolic blood pressure is 80 or higher. Take medicines only as told by your health care provider. Follow the directions carefully. Blood pressure medicines must be taken as prescribed. The medicine does not work as well when you skip doses. Skipping doses also puts you at risk for problems. Contact a health care provider if:  You think you are having a reaction to medicines you have taken.  You have repeated (recurrent) headaches.  You feel dizzy.  You have swelling in your ankles.  You have trouble with your vision. Get help right away if:  You develop a severe headache or confusion.  You have unusual weakness or numbness, or you feel faint.  You have severe pain in your chest or abdomen.  You vomit repeatedly.  You have trouble breathing. Summary  Hypertension is when the force of blood pumping  through your arteries is too strong. If this condition is not controlled, it may put you at risk for serious complications.  Your personal target blood pressure may vary depending on your medical conditions, your age, and other factors. For most people, a normal blood pressure is less than 120/80.  Hypertension is managed by lifestyle changes, medicines, or both. Lifestyle changes include weight loss, eating a healthy, low-sodium diet, exercising more, and limiting alcohol. This information is not intended to replace advice given to you by your health care provider. Make sure you discuss any questions you have with your health care provider. Document Revised: 03/03/2019 Document Reviewed: 10/07/2016 Elsevier Patient Education  2020 [employer]  

## 2020-08-14 ENCOUNTER — Telehealth: Payer: Self-pay | Admitting: Gerontology

## 2020-08-14 NOTE — Telephone Encounter (Signed)
LVM for pt about rescheduling December appt. MD9/22@4 :01.

## 2020-10-31 ENCOUNTER — Other Ambulatory Visit: Payer: Self-pay

## 2020-10-31 ENCOUNTER — Ambulatory Visit: Payer: Medicaid Other | Admitting: Family Medicine

## 2020-10-31 VITALS — BP 124/79 | HR 82 | Ht 71.0 in | Wt 256.5 lb

## 2020-10-31 DIAGNOSIS — R252 Cramp and spasm: Secondary | ICD-10-CM

## 2020-10-31 NOTE — Patient Instructions (Signed)
Muscle Cramps and Spasms Muscle cramps and spasms are when muscles tighten by themselves. They usually get better within minutes. Muscle cramps are painful. They are usually stronger and last longer than muscle spasms. Muscle spasms may or may not be painful. They can last a few seconds or much longer. Cramps and spasms can affect any muscle, but they occur most often in the calf muscles of the leg. They are usually not caused by a serious problem. In many cases, the cause is not known. Some common causes include:  Doing more physical work or exercise than your body is ready for.  Using the muscles too much (overuse) by repeating certain movements too many times.  Staying in a certain position for a long time.  Playing a sport or doing an activity without preparing properly.  Using bad form or technique while playing a sport or doing an activity.  Not having enough water in your body (dehydration).  Injury.  Side effects of some medicines.  Low levels of the salts and minerals in your blood (electrolytes), such as low potassium or calcium. Follow these instructions at home: Managing pain and stiffness      Massage, stretch, and relax the muscle. Do this for many minutes at a time.  If told, put heat on tight or tense muscles as often as told by your doctor. Use the heat source that your doctor recommends, such as a moist heat pack or a heating pad. ? Place a towel between your skin and the heat source. ? Leave the heat on for 20-30 minutes. ? Remove the heat if your skin turns bright red. This is very important if you are not able to feel pain, heat, or cold. You may have a greater risk of getting burned.  If told, put ice on the affected area. This may help if you are sore or have pain after a cramp or spasm. ? Put ice in a plastic bag. ? Place a towel between your skin and the bag. ? Leave the ice on for 20 minutes, 2-3 times a day.  Try taking hot showers or baths to help  relax tight muscles. Eating and drinking  Drink enough fluid to keep your pee (urine) pale yellow.  Eat a healthy diet to help ensure that your muscles work well. This should include: ? Fruits and vegetables. ? Lean protein. ? Whole grains. ? Low-fat or nonfat dairy products. General instructions  If you are having cramps often, avoid intense exercise for several days.  Take over-the-counter and prescription medicines only as told by your doctor.  Watch for any changes in your symptoms.  Keep all follow-up visits as told by your doctor. This is important. Contact a doctor if:  Your cramps or spasms get worse or happen more often.  Your cramps or spasms do not get better with time. Summary  Muscle cramps and spasms are when muscles tighten by themselves. They usually get better within minutes.  Cramps and spasms occur most often in the calf muscles of the leg.  Massage, stretch, and relax the muscle. This may help the cramp or spasm go away.  Drink enough fluid to keep your pee (urine) pale yellow. This information is not intended to replace advice given to you by your health care provider. Make sure you discuss any questions you have with your health care provider. Document Revised: 04/04/2018 Document Reviewed: 04/04/2018 Elsevier Patient Education  2020 [employer] Inc[employer]  

## 2020-10-31 NOTE — Progress Notes (Signed)
  [facility]   Progress Note: General Provider: Mike Gip, FNP  SUBJECTIVE:   Scott Morrison is a 49 y.o. male who  has a past medical history of Renal disorder.. Patient presents today for Follow-up and Leg Pain (Bilateral cramping, on stretching and lying in bed)  Patient reports discontinuing his BP medications. He reports that his home BP readings have been normal. BP 124/79 in office today. He reports that he is having intermittent leg cramping that occurs when he stretches or lays down. He says that he has recently increased his water intake.  He states that his shoulder pain has resolved.   Review of Systems  Musculoskeletal: Myalgias: muscle cramping- intermittent.  All other systems reviewed and are negative.    OBJECTIVE: BP 124/79 (BP Location: Left Arm, Patient Position: Sitting, Cuff Size: Normal)   Pulse 82   Ht 5\' 11"  (1.803 m)   Wt 256 lb 8 oz (116.3 kg)   SpO2 95%   BMI 35.77 kg/m   Wt Readings from Last 3 Encounters:  10/31/20 256 lb 8 oz (116.3 kg)  05/08/20 243 lb (110.2 kg)  03/07/20 243 lb (110.2 kg)     Physical Exam HENT:     Head: Normocephalic and atraumatic.  Cardiovascular:     Rate and Rhythm: Normal rate and regular rhythm.     Pulses: Normal pulses.     Heart sounds: Normal heart sounds.  Pulmonary:     Effort: Pulmonary effort is normal.     Breath sounds: Normal breath sounds. No wheezing, rhonchi or rales.  Musculoskeletal:        General: No swelling or tenderness. Normal range of motion.  Neurological:     Mental Status: He is alert and oriented to person, place, and time.  Psychiatric:        Mood and Affect: Mood normal.        Behavior: Behavior normal.        Thought Content: Thought content normal.        Judgment: Judgment normal.     ASSESSMENT/PLAN:   1. Muscle cramping Discussed adequate hydration. Will check potassium and magnesium levels.  - Comprehensive metabolic panel;  Future    Return in about 6 months (around 05/01/2021), or if symptoms worsen or fail to improve, for labs- future next week.    The patient was given clear instructions to go to ER or return to medical center if symptoms do not improve, worsen or new problems develop. The patient verbalized understanding and agreed with plan of care.   Ms. 07/01/2021. Freda Jackson, FNP-BC [facility]

## 2020-11-07 ENCOUNTER — Other Ambulatory Visit: Payer: Medicaid Other

## 2020-11-07 ENCOUNTER — Ambulatory Visit: Payer: Medicaid Other | Admitting: Gerontology

## 2021-02-18 ENCOUNTER — Other Ambulatory Visit: Payer: Self-pay

## 2021-02-18 ENCOUNTER — Emergency Department
Admission: EM | Admit: 2021-02-18 | Discharge: 2021-02-18 | Disposition: A | Discharge status: Home / Self Care | Payer: BC Managed Care – PPO | Attending: Emergency Medicine | Admitting: Emergency Medicine

## 2021-02-18 ENCOUNTER — Encounter: Payer: Self-pay | Admitting: Emergency Medicine

## 2021-02-18 ENCOUNTER — Emergency Department: Payer: BC Managed Care – PPO

## 2021-02-18 DIAGNOSIS — R10A0 Flank pain, unspecified side: Secondary | ICD-10-CM

## 2021-02-18 DIAGNOSIS — Z87891 Personal history of nicotine dependence: Secondary | ICD-10-CM | POA: Insufficient documentation

## 2021-02-18 DIAGNOSIS — Z79899 Other long term (current) drug therapy: Secondary | ICD-10-CM | POA: Diagnosis not present

## 2021-02-18 DIAGNOSIS — R110 Nausea: Secondary | ICD-10-CM | POA: Insufficient documentation

## 2021-02-18 DIAGNOSIS — R109 Unspecified abdominal pain: Secondary | ICD-10-CM

## 2021-02-18 DIAGNOSIS — I10 Essential (primary) hypertension: Secondary | ICD-10-CM | POA: Insufficient documentation

## 2021-02-18 DIAGNOSIS — R1032 Left lower quadrant pain: Secondary | ICD-10-CM | POA: Insufficient documentation

## 2021-02-18 DIAGNOSIS — R1031 Right lower quadrant pain: Secondary | ICD-10-CM | POA: Insufficient documentation

## 2021-02-18 LAB — COMPREHENSIVE METABOLIC PANEL
ALT: 29 U/L (ref 0–44)
AST: 31 U/L (ref 15–41)
Albumin: 3.9 g/dL (ref 3.5–5.0)
Alkaline Phosphatase: 44 U/L (ref 38–126)
Anion gap: 5 (ref 5–15)
BUN: 18 mg/dL (ref 6–20)
CO2: 24 mmol/L (ref 22–32)
Calcium: 8.7 mg/dL — ABNORMAL LOW (ref 8.9–10.3)
Chloride: 108 mmol/L (ref 98–111)
Creatinine, Ser: 1.04 mg/dL (ref 0.61–1.24)
GFR, Estimated: >60 mL/min (ref >60)
Glucose, Bld: 118 mg/dL — ABNORMAL HIGH (ref 70–99)
Potassium: 4.2 mmol/L (ref 3.5–5.1)
Sodium: 137 mmol/L (ref 135–145)
Total Bilirubin: 0.7 mg/dL (ref 0.3–1.2)
Total Protein: 7.3 g/dL (ref 6.5–8.1)

## 2021-02-18 LAB — CBC
HCT: 41.2 % (ref 39.0–52.0)
Hemoglobin: 14.9 g/dL (ref 13.0–17.0)
MCH: 31 pg (ref 26.0–34.0)
MCHC: 36.2 g/dL — ABNORMAL HIGH (ref 30.0–36.0)
MCV: 85.7 fL (ref 80.0–100.0)
Platelets: 251 10*3/uL (ref 150–400)
RBC: 4.81 MIL/uL (ref 4.22–5.81)
RDW: 12.6 % (ref 11.5–15.5)
WBC: 4.6 10*3/uL (ref 4.0–10.5)
nRBC: 0 % (ref 0.0–0.2)

## 2021-02-18 LAB — URINALYSIS, COMPLETE (UACMP) WITH MICROSCOPIC
Bacteria, UA: NONE SEEN
Bilirubin Urine: NEGATIVE
Glucose, UA: NEGATIVE mg/dL
Hgb urine dipstick: NEGATIVE
Ketones, ur: NEGATIVE mg/dL
Leukocytes,Ua: NEGATIVE
Nitrite: NEGATIVE
Protein, ur: NEGATIVE mg/dL
Specific Gravity, Urine: 1.02 (ref 1.005–1.030)
Squamous Epithelial / HPF: NONE SEEN (ref 0–5)
WBC, UA: NONE SEEN WBC/hpf (ref 0–5)
pH: 5 (ref 5.0–8.0)

## 2021-02-18 LAB — LIPASE, BLOOD: Lipase: 35 U/L (ref 11–51)

## 2021-02-18 MED ORDER — ONDANSETRON 4 MG PO TBDP
4.0000 mg | ORAL_TABLET | Freq: Once | ORAL | Status: AC
  Administered 2021-02-18: 4 mg via ORAL
  Filled 2021-02-18: qty 1

## 2021-02-18 MED ORDER — KETOROLAC TROMETHAMINE 30 MG/ML IJ SOLN
30.0000 mg | Freq: Once | INTRAMUSCULAR | Status: AC
  Administered 2021-02-18: 30 mg via INTRAMUSCULAR
  Filled 2021-02-18: qty 1

## 2021-02-18 NOTE — ED Triage Notes (Signed)
Pt states feels like he is getting a kidney stone, states bilateral lower abd pain, and back pain. Pt states difficulty urinating x 30 mins. Pt states hx of kidney stones and pain feels the same.

## 2021-02-18 NOTE — ED Provider Notes (Signed)
[facility] Emergency Department Provider Note   ____________________________________________   Event Date/Time   First MD Initiated Contact with Patient 02/18/21 [phone]     (approximate)  I have reviewed the triage vital signs and the nursing notes.   HISTORY  Chief Complaint Abdominal Pain    HPI KARO ROG is a 50 y.o. male with past medical history of hypertension and kidney stones who presents to the ED complaining of flank and abdominal pain.  Patient reports that he had acute onset bilateral flank pain earlier today while at work.  He describes it as sharp and slightly worse on the left than the right.  It radiates around to the lower quadrants of both sides of his abdomen.  Pain is present intermittently and is not exacerbated or alleviated by anything.  He has had urinary frequency since onset of symptoms, but denies any dysuria or hematuria.  He has felt nauseous but has not vomited, denies any fevers.  He describes current symptoms as similar to prior kidney stones.  He denies any changes in his bowel movements.        Past Medical History:  Diagnosis Date  . Renal disorder     Patient Active Problem List   Diagnosis Date Noted  . Essential hypertension 05/08/2020  . Right shoulder pain 05/08/2020  . Prediabetes 11/14/2019  . Encounter to establish care 10/26/2019  . Elevated blood pressure reading 10/26/2019  . Left hip pain 10/26/2019    History reviewed. No pertinent surgical history.  Prior to Admission medications   Medication Sig Start Date End Date Taking? Authorizing Provider  lisinopril (PRINIVIL) 10 MG tablet Take 1 tablet (10 mg total) by mouth daily. 05/08/20   Iloabachie, Chioma E, NP    Allergies Patient has no known allergies.  History reviewed. No pertinent family history.  Social History Social History   Tobacco Use  . Smoking status: Former Smoker    Packs/day: 1.00    Types: Cigarettes  . Smokeless  tobacco: Current User    Types: Chew  . Tobacco comment: quit 3 years ago  Vaping Use  . Vaping Use: Never used  Substance Use Topics  . Alcohol use: Yes    Alcohol/week: 3.0 - 4.0 standard drinks    Types: 3 - 4 Standard drinks or equivalent per week    Comment: once a month  . Drug use: No    Review of Systems  Constitutional: No fever/chills Eyes: No visual changes. ENT: No sore throat. Cardiovascular: Denies chest pain. Respiratory: Denies shortness of breath. Gastrointestinal: Positive for flank and abdominal pain.  Positive for nausea, no vomiting.  No diarrhea.  No constipation. Genitourinary: Negative for dysuria. Musculoskeletal: Negative for back pain. Skin: Negative for rash. Neurological: Negative for headaches, focal weakness or numbness.  ____________________________________________   PHYSICAL EXAM:  VITAL SIGNS: ED Triage Vitals  Enc Vitals Group     BP 02/18/21 0757 130/67     Pulse Rate 02/18/21 0757 82     Resp 02/18/21 0757 18     Temp 02/18/21 0757 97.9 F (36.6 C)     Temp Source 02/18/21 0757 Oral     SpO2 02/18/21 0757 97 %     Weight 02/18/21 0757 260 lb (117.9 kg)     Height 02/18/21 0757 5\' 11"  (1.803 m)     Head Circumference --      Peak Flow --      Pain Score 02/18/21 0757 7  Pain Loc --      Pain Edu? --      Excl. in GC? --     Constitutional: Alert and oriented. Eyes: Conjunctivae are normal. Head: Atraumatic. Nose: No congestion/rhinnorhea. Mouth/Throat: Mucous membranes are moist. Neck: Normal ROM Cardiovascular: Normal rate, regular rhythm. Grossly normal heart sounds. Respiratory: Normal respiratory effort.  No retractions. Lungs CTAB. Gastrointestinal: Soft and nontender, no CVA tenderness bilaterally. No distention. Genitourinary: deferred Musculoskeletal: No lower extremity tenderness nor edema. Neurologic:  Normal speech and language. No gross focal neurologic deficits are appreciated. Skin:  Skin is warm, dry  and intact. No rash noted. Psychiatric: Mood and affect are normal. Speech and behavior are normal.  ____________________________________________   LABS (all labs ordered are listed, but only abnormal results are displayed)  Labs Reviewed  COMPREHENSIVE METABOLIC PANEL - Abnormal; Notable for the following components:      Result Value   Glucose, Bld 118 (*)    Calcium 8.7 (*)    All other components within normal limits  CBC - Abnormal; Notable for the following components:   MCHC 36.2 (*)    All other components within normal limits  URINALYSIS, COMPLETE (UACMP) WITH MICROSCOPIC - Abnormal; Notable for the following components:   Color, Urine YELLOW (*)    APPearance CLEAR (*)    All other components within normal limits  LIPASE, BLOOD    PROCEDURES  Procedure(s) performed (including Critical Care):  Procedures   ____________________________________________   INITIAL IMPRESSION / ASSESSMENT AND PLAN / ED COURSE       50 year old male with past medical history of hypertension and kidney stones who presents to the ED with acute onset bilateral flank pain earlier today associated with urinary frequency.  Labs thus far are reassuring, LFTs and lipase within normal limits, renal function is stable from previous.  UA shows no hematuria or signs of infection.  We will further assess for kidney stone or other pathology with CT scan, treat with IM Toradol and ODT Zofran.  If work-up is unremarkable, I suspect patient's pain is musculoskeletal in etiology.  CT scan reviewed by me with no obvious ureterolithiasis, negative for acute process per radiology.  Patient reports feeling much better following Toradol and Zofran and I suspect his symptoms are musculoskeletal in etiology or he has already passed a stone.  He is appropriate for discharge home with PCP follow-up, was counseled to return to the ED for new worsening symptoms, patient agrees with plan.       ____________________________________________   FINAL CLINICAL IMPRESSION(S) / ED DIAGNOSES  Final diagnoses:  Flank pain     ED Discharge Orders    None       Note:  This document was prepared using Dragon voice recognition software and may include unintentional dictation errors.   Chesley Noon, MD 02/18/21 1045

## 2021-02-18 NOTE — ED Notes (Signed)
C/o lower back pain, dark urine and difficulty urinating. Hx of kidney stones, states feels similar.

## 2021-02-18 NOTE — ED Notes (Signed)
EDP at bedside  

## 2021-02-18 NOTE — ED Notes (Signed)
Pt alert and oriented X 4, stable for discharge. RR even and unlabored, color WNL. Discussed discharge instructions and follow-up as directed. Discharge medications discussed if prescribed. Pt had opportunity to ask questions, and RN to provide patient/family eduction.  

## 2021-05-01 ENCOUNTER — Ambulatory Visit: Payer: Medicaid Other

## 2023-02-07 IMAGING — CT CT RENAL STONE PROTOCOL
2 of 4 series · 17 of 46 positions shown, 19 images · non-contrast
Comparison: 10/07/2016

CLINICAL DATA: Flank pain with kidney stone suspected

EXAM:
CT ABDOMEN AND PELVIS WITHOUT CONTRAST
TECHNIQUE: Multidetector CT imaging of the abdomen and pelvis was performed
following the standard protocol without IV contrast.

[Series 2: stone full standard · axial · 0.98mm/px · z∈[-1001,-541]mm · 14 of 101 slices shown, 16 images]
[im 5/101  soft-tissue]
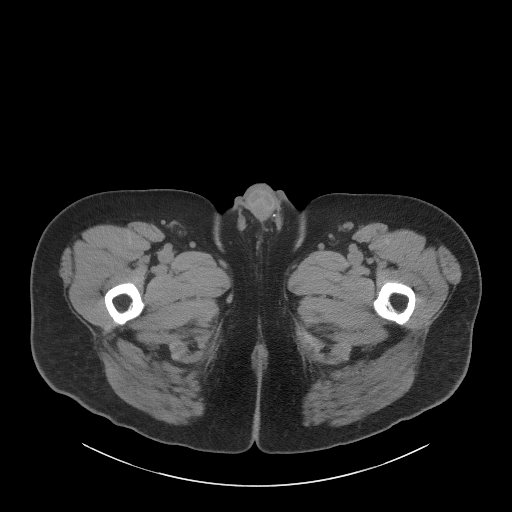
[im 5/101  bone]
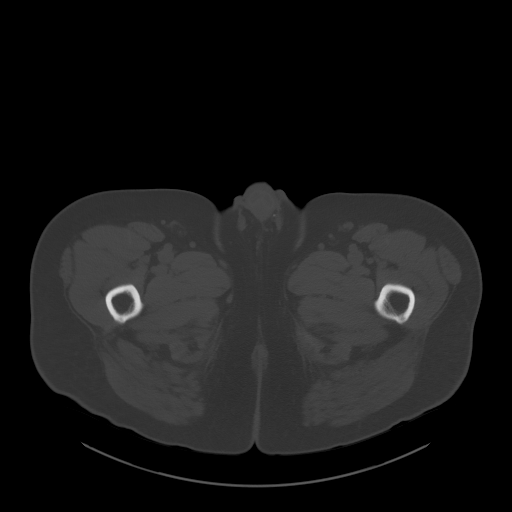
[im 13/101  soft-tissue]
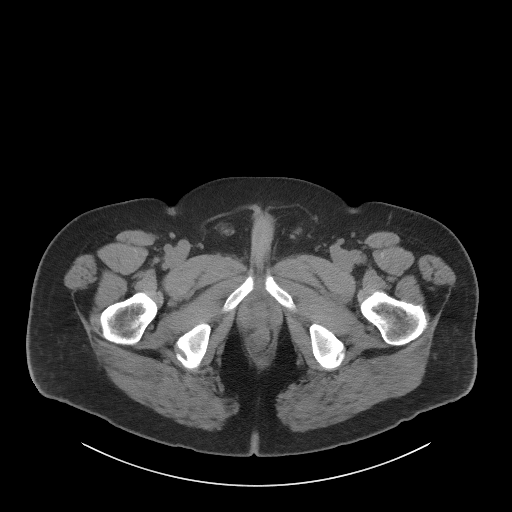
[im 21/101  soft-tissue]
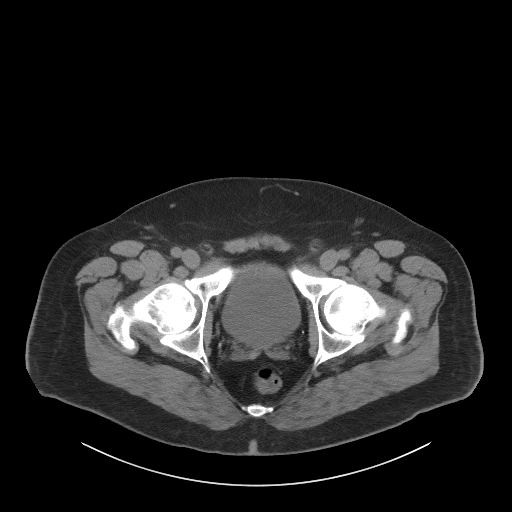
[im 29/101  soft-tissue]
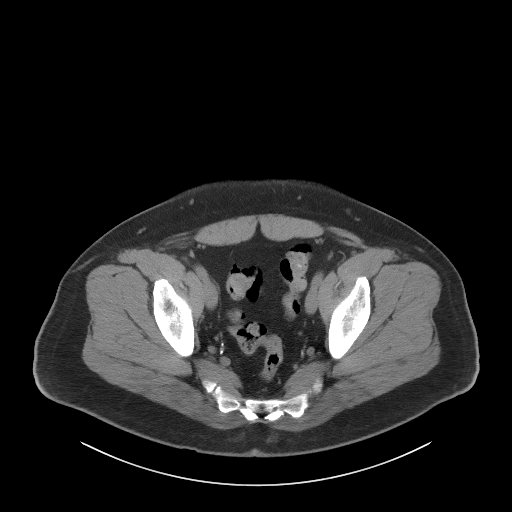
[im 33/101  soft-tissue]
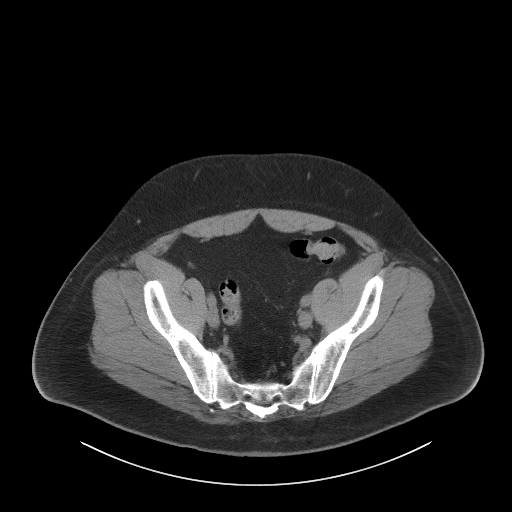
[im 41/101  soft-tissue]
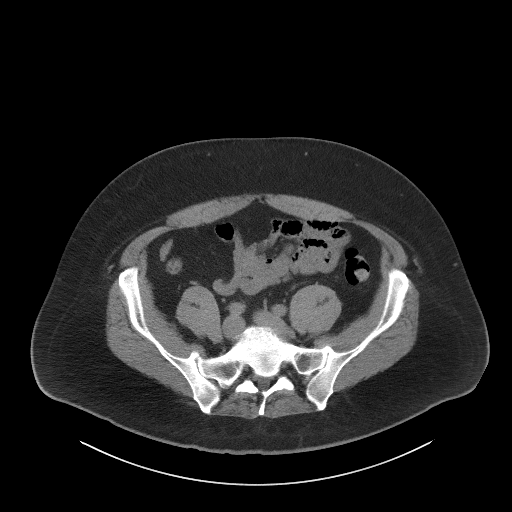
[im 49/101  soft-tissue]
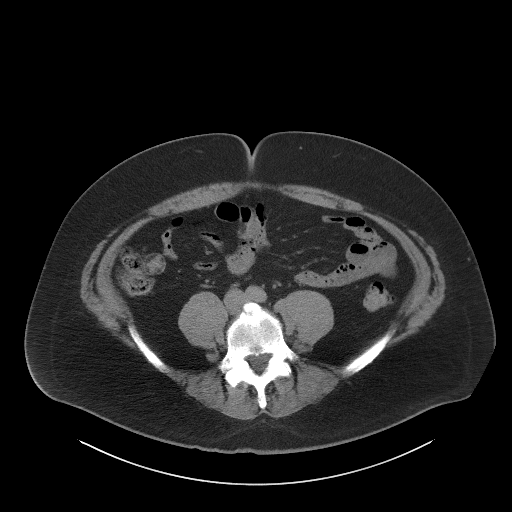
[im 53/101  soft-tissue]
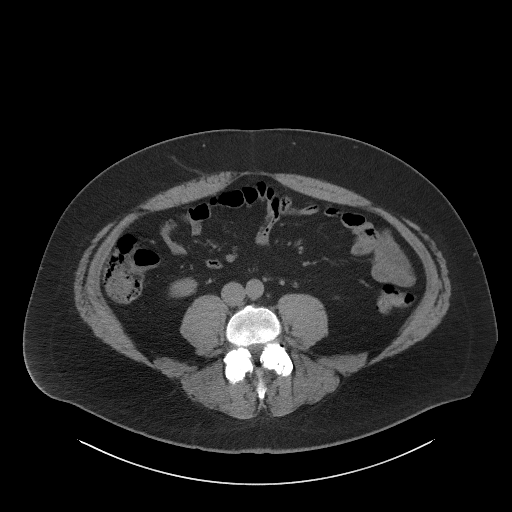
[im 61/101  soft-tissue]
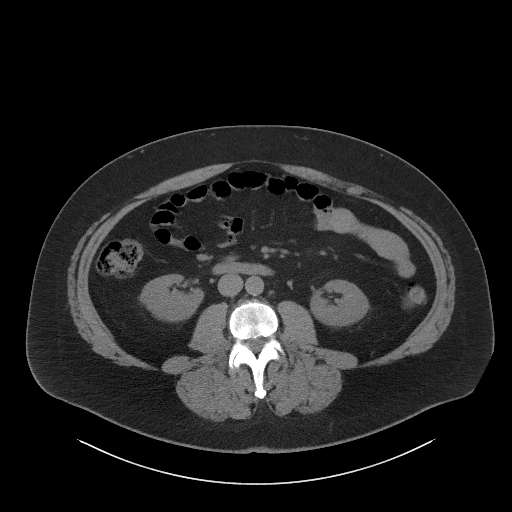
[im 61/101  bone]
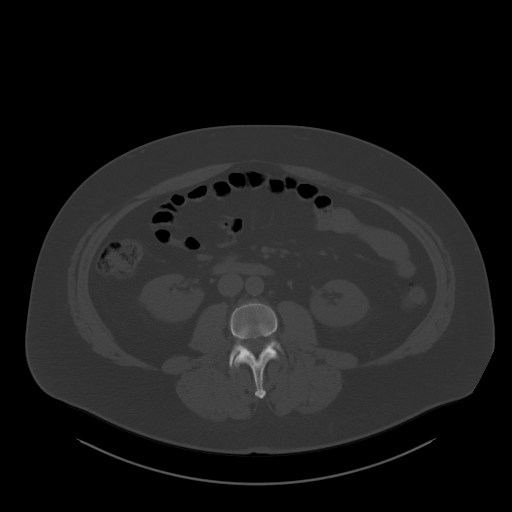
[im 69/101  soft-tissue]
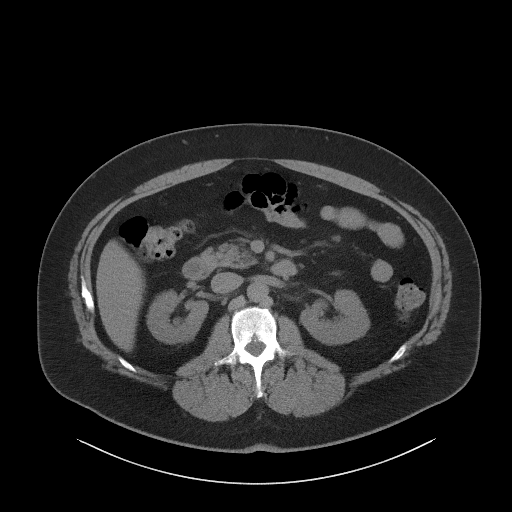
[im 77/101  soft-tissue]
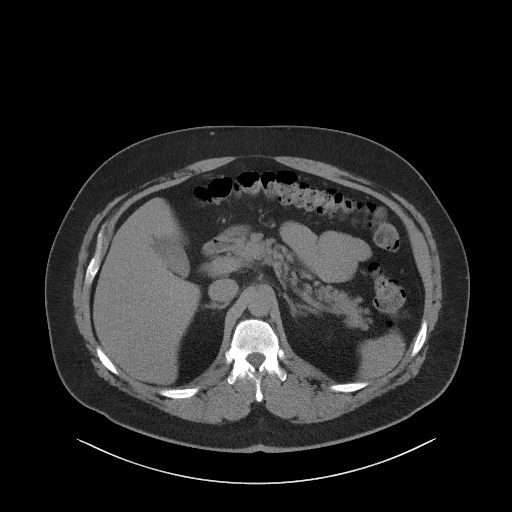
[im 81/101  soft-tissue]
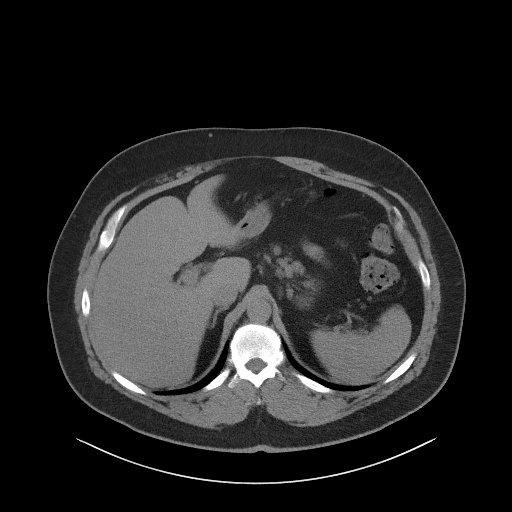
[im 89/101  soft-tissue]
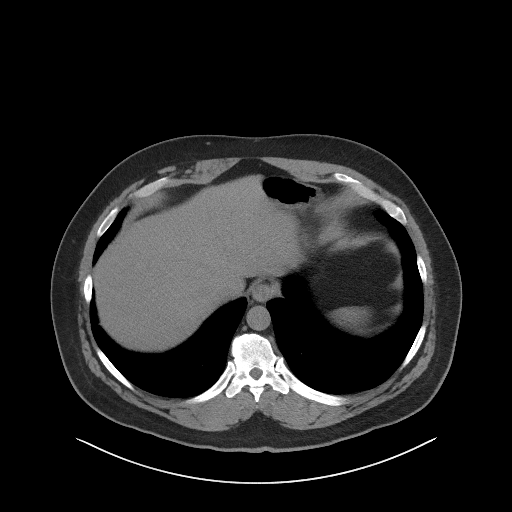
[im 97/101  soft-tissue]
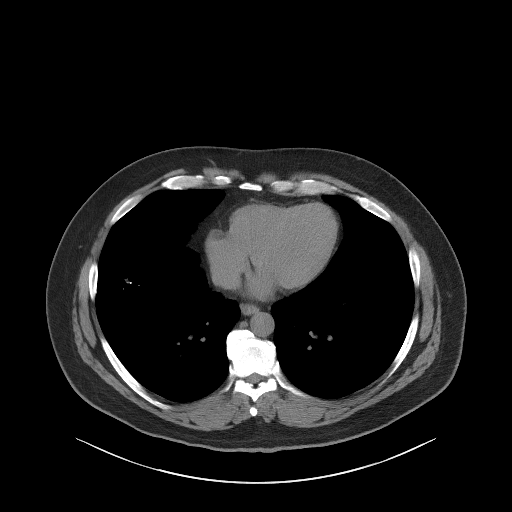

[Series 5: coronal · coronal · 0.91mm/px · 3 of 164 slices shown]
[im 55/164  soft-tissue]
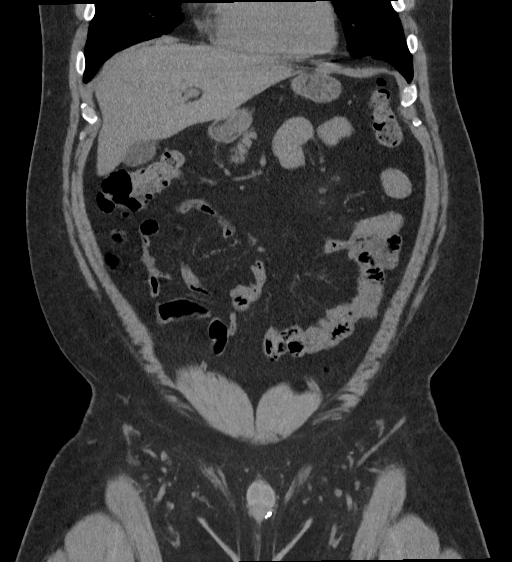
[im 73/164  soft-tissue]
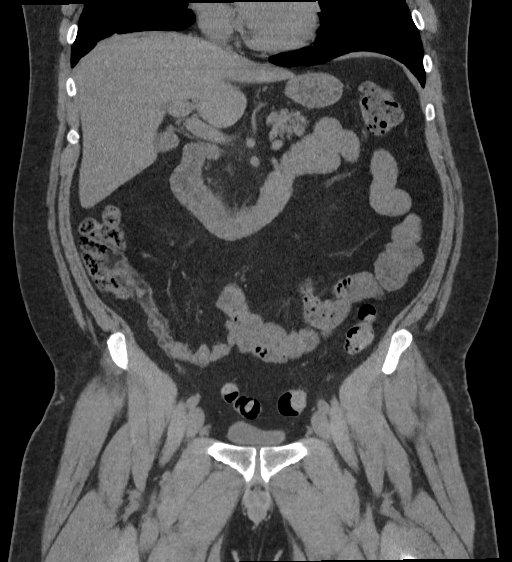
[im 91/164  soft-tissue]
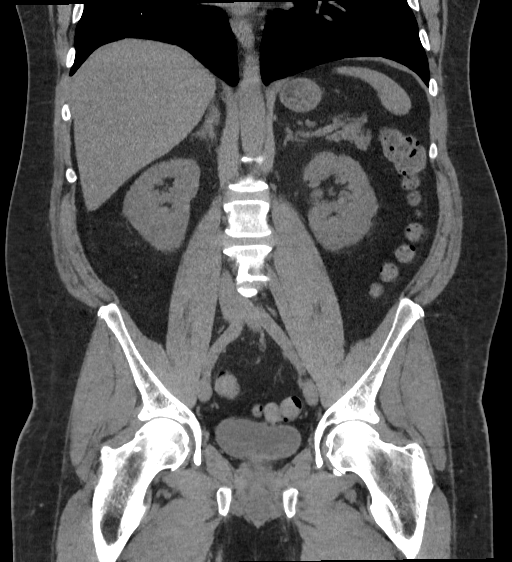

[17 of 46 positions shown; findings below may reference images not displayed]

FINDINGS: Lower chest: Clustered calcified nodules at the right base
attributed to remote inflammation.

Hepatobiliary: No focal liver abnormality.No evidence of biliary
obstruction or stone.

Pancreas: Stable solitary coarse calcification at the uncinate
process. No acute finding.

Spleen: Unremarkable.

Adrenals/Urinary Tract: Negative adrenals. No hydronephrosis or
stone. Small cystic density in the upper pole left kidney.
Unremarkable bladder.

Stomach/Bowel:  No obstruction. No evidence of bowel inflammation.

Vascular/Lymphatic: No acute vascular abnormality. No mass or
adenopathy.

Reproductive:No pathologic findings.

Other: No ascites or pneumoperitoneum.

Musculoskeletal: No acute abnormalities. Disc and facet
degeneration. Facet osteoarthritis is notably advanced at L4-5 and
L5-S1.
IMPRESSION: No hydronephrosis or ureteral calculus.
